# Patient Record
Sex: Male | Born: 1985 | Hispanic: Yes | Marital: Married | Smoking: Never smoker
Health system: Southern US, Community
[De-identification: ages and names within clinical notes are randomized; demographics above are authoritative.]

## PROBLEM LIST (undated history)

## (undated) DIAGNOSIS — E1142 Type 2 diabetes mellitus with diabetic polyneuropathy: Secondary | ICD-10-CM

## (undated) DIAGNOSIS — E785 Hyperlipidemia, unspecified: Secondary | ICD-10-CM

## (undated) DIAGNOSIS — E119 Type 2 diabetes mellitus without complications: Secondary | ICD-10-CM

## (undated) HISTORY — DX: Type 2 diabetes mellitus with diabetic polyneuropathy: E11.42

## (undated) HISTORY — DX: Hyperlipidemia, unspecified: E78.5

---

## 2017-12-06 ENCOUNTER — Ambulatory Visit: Payer: Self-pay | Admitting: Internal Medicine

## 2017-12-06 ENCOUNTER — Encounter: Payer: Self-pay | Admitting: Internal Medicine

## 2017-12-06 VITALS — BP 130/88 | HR 84 | Resp 12 | Ht 65.0 in | Wt 152.0 lb

## 2017-12-06 DIAGNOSIS — E119 Type 2 diabetes mellitus without complications: Secondary | ICD-10-CM

## 2017-12-06 DIAGNOSIS — E1149 Type 2 diabetes mellitus with other diabetic neurological complication: Secondary | ICD-10-CM | POA: Insufficient documentation

## 2017-12-06 DIAGNOSIS — N5082 Scrotal pain: Secondary | ICD-10-CM

## 2017-12-06 DIAGNOSIS — I10 Essential (primary) hypertension: Secondary | ICD-10-CM | POA: Insufficient documentation

## 2017-12-06 DIAGNOSIS — Z23 Encounter for immunization: Secondary | ICD-10-CM

## 2017-12-06 LAB — POCT URINALYSIS DIPSTICK
Bilirubin, UA: NEGATIVE
Ketones, UA: NEGATIVE
LEUKOCYTES UA: NEGATIVE
NITRITE UA: NEGATIVE
PH UA: 5 (ref 5.0–8.0)
RBC UA: NEGATIVE
SPEC GRAV UA: 1.02 (ref 1.010–1.025)
Urobilinogen, UA: 0.2 E.U./dL

## 2017-12-06 LAB — GLUCOSE, POCT (MANUAL RESULT ENTRY): POC GLUCOSE: 301 mg/dL — AB (ref 70–99)

## 2017-12-06 MED ORDER — AGAMATRIX ULTRA-THIN LANCETS MISC: 11 refills | Status: DC

## 2017-12-06 MED ORDER — LISINOPRIL 10 MG PO TABS: 10.0000 mg | ORAL_TABLET | Freq: Every day | ORAL | 11 refills | Status: DC

## 2017-12-06 MED ORDER — GLIPIZIDE 5 MG PO TABS: ORAL_TABLET | ORAL | 11 refills | Status: DC

## 2017-12-06 MED ORDER — AGAMATRIX PRESTO W/DEVICE KIT: PACK | 0 refills | Status: DC

## 2017-12-06 MED ORDER — GLUCOSE BLOOD VI STRP: ORAL_STRIP | 12 refills | Status: DC

## 2017-12-06 MED ORDER — METFORMIN HCL 500 MG PO TABS: ORAL_TABLET | ORAL | 11 refills | Status: DC

## 2017-12-06 NOTE — Progress Notes (Signed)
Social TEFL teacherworker intern performed a new patient's screening to assess for depression symptoms and SDOH issues. Pt. Reported that due to physical issues he has been feeling little interest doing things. Pt reported feeling without energy due to lack of sleep because of pain in his lower extremities. Additionally, pt reported feeling stress and moving slowly due to pain.

## 2017-12-06 NOTE — Progress Notes (Signed)
"    Patient ID: Keith Barnes, male    DOB: [birth year], 32 y.o.   MRN: [id number]  HPI   Here to establish  Went to the [facility] about 25 +days ago as he had pain in his right scrotum and ultimately diagnosed with DM, Essential Hypertension.  He was told the pain in his right groin was a "cyst".  1.  DM:  As above.  States his sugar was 350 in the other office.  Was started on Metformin 500 mg once daily.  He continues with the Metformin.  He also started on a natural medication his visiting friend recommended.  He takes this once daily as well.  States he does not have polydipsia or polyuria.  He states prior to getting started on medications, he did have these symptoms, for about 2 months. His mother has DM.  Diagnosed at age 54. His toes have been numb and itchy. No problem with skin rash there.  2.  Essential Hypertension:  On Lisinopril 10 mg daily for about 25 days as well. He has not had any bloodwork done since starting.  3.  Right groin pain for 15 days ago:  Took Amoxicillin 500 mg twice daily for 5 days.  Did not note any change in the pain. States the pain is not in his testicle.   He does not see any abnormality or feel any lesion. The pain is in the groin and possibly involving the posterior right scrotum or perineum--not clear which. No fevers.  No penile discharge.   Married and sexually active with only his wife. 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       Current Meds  Medication Sig  . lisinopril (PRINIVIL,ZESTRIL) 10 MG tablet Take 1 tablet (10 mg total) by mouth daily.  . metFORMIN (GLUCOPHAGE) 500 MG tablet 1 tab by mouth twice daily with meals  . [DISCONTINUED] lisinopril (PRINIVIL,ZESTRIL) 10 MG tablet Take 10 mg by mouth daily.  . [DISCONTINUED] metFORMIN (GLUCOPHAGE) 500 MG tablet Take by mouth daily with breakfast.    No Known  Allergies   History reviewed. No pertinent past medical history.   History reviewed. No pertinent surgical history.   History reviewed. No pertinent family history.   Social History   Socioeconomic History  . Marital status: Single    Spouse name: Not on file  . Number of children: 1  . Years of education: Not on file  . Highest education level: Not on file  Social Needs  . Financial resource strain: Not on file  . Food insecurity - worry: Not on file  . Food insecurity - inability: Not on file  . Transportation needs - medical: No  . Transportation needs - non-medical: No  Occupational History  . Occupation: cook    Comment: [employer]  Tobacco Use  . Smoking status: Never Smoker  . Smokeless tobacco: Never Used  Substance and Sexual Activity  . Alcohol use: No    Frequency: Never    Comment: Did drink a lot of beer before diagnosis of DM, but none now.  . Drug use: No  . Sexual activity: Yes  Other Topics Concern  . Not on file  Social History Narrative   Lives at home with wife and 2 yo son.      Review of Systems     Objective:  Physical Exam  NAD HEENT:  PERRL, EOMI, discs sharp.  Mild scleral icterus?  Mild periorbital fat vs edema.  TMs pearly gray. Gums with severe recession and many loose and crooked, decayed teeth.  Throat without injection.  MMM Neck: Supple, No adenopathy, no thyromegaly Chest:  CTA CV:  RRR with normal S1 and S2, no S3, S4, or murmur.  Radial and DP pulses normal and equal Abd:  S, NT, No HSM or mass, + BS.  No prominence of abdominal wall veins.  No fluid wave. LE:  No edema.   UE:  No palmar erythema. GU:  Normal uncircumcised male, lack of foreskin/glans hygiene, but no penile discharge or genital lesion.  No testicular mass or tenderness.  Mildly tender at ventral base really of penile shaft with palpation through posterior scrotum midline.  Not clear exactly what is tender.  No erythema.  NT on perineum.       Assessment  & Plan:  1.  DM:  Start Glipizide 5 mg and increase Metformin to 500 mg both twice daily with meals. Once he gets and orange card, to obtain glucometer and testing supplies and keep track of sugars. Check renal function with CMP after start of Lisinopril and Metformin as well.  2.  Essential Hypertension:  Continue Lisinopril as above.  3.  ?Hyperbilirubinemia:  CMP for Tbil.  History of heavy alcohol use until recent diagnosis of DM.  4.  Posterior scrotal area tenderness vs. Penile shaft tenderness.  This does not seem to be even moderately painful.  He does state it is much better.  Follow for now.  [facility]  5.  Alcoholism:  Would like for him to recontact N. Aviva SignsWasserberg for counseling to maintain sobriety.  Follow up in 4 weeks.

## 2017-12-07 LAB — COMPREHENSIVE METABOLIC PANEL
A/G RATIO: 1.4 (ref 1.2–2.2)
ALBUMIN: 4.5 g/dL (ref 3.5–5.5)
ALT: 20 IU/L (ref 0–44)
AST: 12 IU/L (ref 0–40)
Alkaline Phosphatase: 79 IU/L (ref 39–117)
BUN / CREAT RATIO: 19 (ref 9–20)
BUN: 12 mg/dL (ref 6–20)
Bilirubin Total: 1.4 mg/dL — ABNORMAL HIGH (ref 0.0–1.2)
CALCIUM: 9.7 mg/dL (ref 8.7–10.2)
CO2: 21 mmol/L (ref 20–29)
CREATININE: 0.62 mg/dL — AB (ref 0.76–1.27)
Chloride: 99 mmol/L (ref 96–106)
GFR calc Af Amer: 153 mL/min/{1.73_m2} (ref >59)
GFR, EST NON AFRICAN AMERICAN: 132 mL/min/{1.73_m2} (ref >59)
GLOBULIN, TOTAL: 3.3 g/dL (ref 1.5–4.5)
Glucose: 268 mg/dL — ABNORMAL HIGH (ref 65–99)
POTASSIUM: 4.2 mmol/L (ref 3.5–5.2)
SODIUM: 137 mmol/L (ref 134–144)
Total Protein: 7.8 g/dL (ref 6.0–8.5)

## 2018-01-01 LAB — SPECIMEN STATUS REPORT

## 2018-01-01 LAB — BILIRUBIN, DIRECT: Bilirubin, Direct: 0.1 mg/dL (ref 0.00–0.40)

## 2018-01-10 ENCOUNTER — Other Ambulatory Visit: Payer: Self-pay

## 2018-01-11 LAB — HEPATIC FUNCTION PANEL
ALT: 19 IU/L (ref 0–44)
AST: 16 IU/L (ref 0–40)
Albumin: 4.7 g/dL (ref 3.5–5.5)
Alkaline Phosphatase: 54 IU/L (ref 39–117)
Bilirubin Total: 1.6 mg/dL — ABNORMAL HIGH (ref 0.0–1.2)
Bilirubin, Direct: 0.34 mg/dL (ref 0.00–0.40)
Total Protein: 7.9 g/dL (ref 6.0–8.5)

## 2018-01-11 LAB — DIRECT ANTIGLOBULIN TEST (NOT AT ARMC): COOMBS', DIRECT: NEGATIVE

## 2018-01-11 LAB — LACTATE DEHYDROGENASE: LDH: 149 IU/L (ref 121–224)

## 2018-01-17 ENCOUNTER — Ambulatory Visit: Payer: Self-pay | Admitting: Internal Medicine

## 2018-01-17 ENCOUNTER — Encounter: Payer: Self-pay | Admitting: Internal Medicine

## 2018-01-17 VITALS — BP 126/82 | HR 92 | Resp 12 | Ht 65.0 in | Wt 149.0 lb

## 2018-01-17 DIAGNOSIS — G6289 Other specified polyneuropathies: Secondary | ICD-10-CM

## 2018-01-17 DIAGNOSIS — I10 Essential (primary) hypertension: Secondary | ICD-10-CM

## 2018-01-17 DIAGNOSIS — E119 Type 2 diabetes mellitus without complications: Secondary | ICD-10-CM

## 2018-01-17 LAB — GLUCOSE, POCT (MANUAL RESULT ENTRY): POC Glucose: 211 mg/dl — AB (ref 70–99)

## 2018-01-17 MED ORDER — GABAPENTIN 100 MG PO CAPS: ORAL_CAPSULE | ORAL | 3 refills | Status: DC

## 2018-01-17 NOTE — Progress Notes (Signed)
   Subjective:    Patient ID: Keith Barnes, male    DOB: 1986/08/06, 32 y.o.   MRN: [id number]  HPI   Records from [facility] are difficult to read, but appears he was treated for DM, hypertension and orchitis with previously mentioned medications (last OV) on 10/30/17. Not clear why Amoxicillin utilized.  1.  DM:  Did not get diabetic supplies yet, so not checking.  He denies polydipsia or polyuria.  Feels much better. Not clear still however, what his sugars are running. He is eating better, but describes eating 6 servings of fruit and 2 of vegetables.   2.  Alcohol abuse:  No alcohol for 3 months.  His total bilirubin was mildly elevated, appears to be indirect etiology, but does not appear to be a problem with hemolysis as LDH and direct coombs normal.  3.  Pain in feet  Current Meds  Medication Sig  . AGAMATRIX ULTRA-THIN LANCETS MISC Check sugars twice daily before meals  . Blood Glucose Monitoring Suppl (AGAMATRIX PRESTO) w/Device KIT Check sugars twice daily before meals  . glipiZIDE (GLUCOTROL) 5 MG tablet 1 tab by mouth twice daily with meals  . glucose blood (AGAMATRIX PRESTO TEST) test strip Check sugars twice daily before meals  . lisinopril (PRINIVIL,ZESTRIL) 10 MG tablet Take 1 tablet (10 mg total) by mouth daily.  . metFORMIN (GLUCOPHAGE) 500 MG tablet 1 tab by mouth twice daily with meals   No Known Allergies  Review of Systems     Objective:   Physical Exam   NAD HEENT:  Scleral icterus appears resolved.  Throat without injection Neck: Supple, No adenopathy Chest:  CTA CV:  RRR without murmur or rub. Radial pulses normal and equal Abd:  S, NT, No HSM  Diabetic Foot Exam - Simple   Simple Foot Form Visual Inspection No deformities, no ulcerations, no other skin breakdown bilaterally:  Yes Sensation Testing See comments:  Yes Pulse Check Posterior Tibialis and Dorsalis pulse intact bilaterally:  Yes Comments Decreased sensation to  monofilament most of left plantar foot.  Right missing at MTP plantar area of 1st ray.  Dorsal feet with sensation.         Assessment & Plan:  1. DM:  Likely improved, but needs to get his diabetic supplies.  Discussed how to document sugars in journal.  To bring in in 1 month for evaluation.  With me in 3 months.  2.  Peripheral neuropathy:  Start bedtime Gabapentin and titrate to 300 mg at bedtime only for now.  3.  Mild indirect hyperbilirubinemia:  No hemolysis noted.  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  No alcohol  4.  Alcohol Abuse:  Seems to be able to stay away from this now.

## 2018-01-23 ENCOUNTER — Other Ambulatory Visit: Payer: Self-pay

## 2018-01-23 MED ORDER — GABAPENTIN 100 MG PO CAPS: ORAL_CAPSULE | ORAL | 1 refills | Status: DC

## 2018-03-16 DIAGNOSIS — N433 Hydrocele, unspecified: Secondary | ICD-10-CM | POA: Insufficient documentation

## 2018-03-16 DIAGNOSIS — N503 Cyst of epididymis: Secondary | ICD-10-CM

## 2018-03-16 HISTORY — DX: Hydrocele, unspecified: N43.3

## 2018-03-16 HISTORY — DX: Cyst of epididymis: N50.3

## 2018-04-04 ENCOUNTER — Emergency Department (HOSPITAL_COMMUNITY): Payer: Self-pay

## 2018-04-04 ENCOUNTER — Encounter (HOSPITAL_COMMUNITY): Payer: Self-pay | Admitting: Emergency Medicine

## 2018-04-04 ENCOUNTER — Other Ambulatory Visit: Payer: Self-pay

## 2018-04-04 ENCOUNTER — Emergency Department (HOSPITAL_COMMUNITY)
Admission: EM | Admit: 2018-04-04 | Discharge: 2018-04-04 | Disposition: A | Discharge status: Home / Self Care | Payer: Self-pay | Attending: Emergency Medicine | Admitting: Emergency Medicine

## 2018-04-04 DIAGNOSIS — Z7984 Long term (current) use of oral hypoglycemic drugs: Secondary | ICD-10-CM | POA: Insufficient documentation

## 2018-04-04 DIAGNOSIS — I10 Essential (primary) hypertension: Secondary | ICD-10-CM | POA: Insufficient documentation

## 2018-04-04 DIAGNOSIS — E119 Type 2 diabetes mellitus without complications: Secondary | ICD-10-CM | POA: Insufficient documentation

## 2018-04-04 DIAGNOSIS — R109 Unspecified abdominal pain: Secondary | ICD-10-CM | POA: Insufficient documentation

## 2018-04-04 DIAGNOSIS — N50819 Testicular pain, unspecified: Secondary | ICD-10-CM | POA: Insufficient documentation

## 2018-04-04 HISTORY — DX: Type 2 diabetes mellitus without complications: E11.9

## 2018-04-04 LAB — COMPREHENSIVE METABOLIC PANEL
ALT: 15 U/L — ABNORMAL LOW (ref 17–63)
AST: 20 U/L (ref 15–41)
Albumin: 4.3 g/dL (ref 3.5–5.0)
Alkaline Phosphatase: 45 U/L (ref 38–126)
Anion gap: 8 (ref 5–15)
BUN: 9 mg/dL (ref 6–20)
CHLORIDE: 105 mmol/L (ref 101–111)
CO2: 26 mmol/L (ref 22–32)
Calcium: 9.7 mg/dL (ref 8.9–10.3)
Creatinine, Ser: 0.6 mg/dL — ABNORMAL LOW (ref 0.61–1.24)
GFR calc Af Amer: >60 mL/min (ref >60)
GLUCOSE: 139 mg/dL — AB (ref 65–99)
POTASSIUM: 3.8 mmol/L (ref 3.5–5.1)
Sodium: 139 mmol/L (ref 135–145)
Total Bilirubin: 2 mg/dL — ABNORMAL HIGH (ref 0.3–1.2)
Total Protein: 7.6 g/dL (ref 6.5–8.1)

## 2018-04-04 LAB — CBC
HEMATOCRIT: 36 % — AB (ref 39.0–52.0)
Hemoglobin: 12.5 g/dL — ABNORMAL LOW (ref 13.0–17.0)
MCH: 31.2 pg (ref 26.0–34.0)
MCHC: 34.7 g/dL (ref 30.0–36.0)
MCV: 89.8 fL (ref 78.0–100.0)
PLATELETS: 243 10*3/uL (ref 150–400)
RBC: 4.01 MIL/uL — ABNORMAL LOW (ref 4.22–5.81)
RDW: 11.9 % (ref 11.5–15.5)
WBC: 6.6 10*3/uL (ref 4.0–10.5)

## 2018-04-04 LAB — URINALYSIS, ROUTINE W REFLEX MICROSCOPIC
BILIRUBIN URINE: NEGATIVE
GLUCOSE, UA: NEGATIVE mg/dL
HGB URINE DIPSTICK: NEGATIVE
KETONES UR: NEGATIVE mg/dL
Leukocytes, UA: NEGATIVE
Nitrite: NEGATIVE
PH: 6 (ref 5.0–8.0)
PROTEIN: NEGATIVE mg/dL
Specific Gravity, Urine: 1.014 (ref 1.005–1.030)

## 2018-04-04 LAB — CBG MONITORING, ED: Glucose-Capillary: 129 mg/dL — ABNORMAL HIGH (ref 65–99)

## 2018-04-04 LAB — LIPASE, BLOOD: LIPASE: 35 U/L (ref 11–51)

## 2018-04-04 MED ORDER — ONDANSETRON 4 MG PO TBDP
4.0000 mg | ORAL_TABLET | Freq: Once | ORAL | Status: AC
  Administered 2018-04-04: 4 mg via ORAL
  Filled 2018-04-04: qty 1

## 2018-04-04 MED ORDER — METFORMIN HCL 500 MG PO TABS: ORAL_TABLET | ORAL | 11 refills | Status: DC

## 2018-04-04 MED ORDER — TRAMADOL HCL 50 MG PO TABS: 50.0000 mg | ORAL_TABLET | Freq: Four times a day (QID) | ORAL | 0 refills | Status: DC | PRN

## 2018-04-04 MED ORDER — IOHEXOL 300 MG/ML  SOLN
100.0000 mL | Freq: Once | INTRAMUSCULAR | Status: AC | PRN
  Administered 2018-04-04: 100 mL via INTRAVENOUS

## 2018-04-04 MED ORDER — LISINOPRIL 10 MG PO TABS: 10.0000 mg | ORAL_TABLET | Freq: Every day | ORAL | 11 refills | Status: DC

## 2018-04-04 MED ORDER — HYDROCODONE-ACETAMINOPHEN 5-325 MG PO TABS
1.0000 | ORAL_TABLET | Freq: Once | ORAL | Status: AC
  Administered 2018-04-04: 1 via ORAL
  Filled 2018-04-04: qty 1

## 2018-04-04 NOTE — ED Notes (Signed)
Pt informed he needs to provide UA when possible. Pt given urinal / verbalized understanding.   

## 2018-04-04 NOTE — ED Triage Notes (Signed)
Spanish translator used:  Pt c/o nausea/vomiting/constant lower abdominal pain x 1 week. Hx diabetes, has not been checking blood sugars at home. Also c/o bilateral foot pain x 1 month. Denies chest pain/shortness of breath.

## 2018-04-04 NOTE — ED Provider Notes (Signed)
[facility] EMERGENCY DEPARTMENT Provider Note   CSN: [id number] Arrival date & time: 04/04/18  1603     History   Chief Complaint Chief Complaint  Patient presents with  . Abdominal Pain    HPI Keith Barnes is a 32 y.o. male.  HPI   32 year old male presents today with numerous complaints.  Patient notes several month history of diffuse abdominal pain, he notes this is worse after eating.  Patient notes intermittent diarrhea, intermittent nausea and vomiting.  Patient also reports pain in his testicles bilateral, he notes he is been seen as an outpatient for this with no significant findings.  Patient denies any penile discharge, dysuria swelling or edema; no trauma to the testicles.  Patient reports bilateral leg pain with intermittent numbness and tingling.  Patient notes very minimal lower lumbar pain.  He denies any distal extremity weakness.  Denies any fever or chills.  Past Medical History:  Diagnosis Date  . Diabetes mellitus without complication [facility])     Patient Active Problem List   Diagnosis Date Noted  . Type 2 diabetes mellitus without complication, without long-term current use of insulin ([place]) 12/06/2017  . Essential hypertension 12/06/2017    History reviewed. No pertinent surgical history.      Home Medications    Prior to Admission medications   Medication Sig Start Date End Date Taking? Authorizing Provider  Truddie Crumble ULTRA-THIN LANCETS MISC Check sugars twice daily before meals 12/06/17   Mack Hook, MD  Blood Glucose Monitoring Suppl (AGAMATRIX PRESTO) w/Device KIT Check sugars twice daily before meals 12/06/17   Mack Hook, MD  gabapentin (NEURONTIN) 100 MG capsule 1 cap by mouth at bedtime for 3 nights then increase to 2 caps for 3 days then increase to 3 caps and stay on that dose 01/23/18   Mack Hook, MD  glipiZIDE (GLUCOTROL) 5 MG tablet 1 tab by mouth twice daily with meals 12/06/17   Mack Hook, MD  glucose blood (AGAMATRIX PRESTO TEST) test strip Check sugars twice daily before meals 12/06/17   Mack Hook, MD  lisinopril (PRINIVIL,ZESTRIL) 10 MG tablet Take 1 tablet (10 mg total) by mouth daily. 04/04/18   Christan Defranco, Dellis Filbert, PA-C  metFORMIN (GLUCOPHAGE) 500 MG tablet 1 tab by mouth twice daily with meals 04/04/18   Evaline Waltman, Dellis Filbert, PA-C  traMADol (ULTRAM) 50 MG tablet Take 1 tablet (50 mg total) by mouth every 6 (six) hours as needed. 04/04/18   Okey Regal, PA-C    Family History No family history on file.  Social History Social History   Tobacco Use  . Smoking status: Never Smoker  . Smokeless tobacco: Never Used  Substance Use Topics  . Alcohol use: No    Frequency: Never    Comment: Did drink a lot of beer before diagnosis of DM, but none now.  . Drug use: No     Allergies   Patient has no known allergies.   Review of Systems Review of Systems  All other systems reviewed and are negative.    Physical Exam Updated Vital Signs BP 122/84 (BP Location: Right Arm)   Pulse (!) 55   Resp 14   SpO2 100%   Physical Exam  Constitutional: He is oriented to person, place, and time. He appears well-developed and well-nourished.  HENT:  Head: Normocephalic and atraumatic.  Eyes: Pupils are equal, round, and reactive to light. Conjunctivae are normal. Right eye exhibits no discharge. Left eye exhibits no discharge. No scleral icterus.  Neck: Normal range of motion. No JVD present. No tracheal deviation present.  Pulmonary/Chest: Effort normal. No stridor.  Abdominal:  Diffuse abdominal tenderness, nonfocal, no rebound or guarding, no masses  Genitourinary:  Genitourinary Comments: Uncircumcised penis, no penile discharge, bilateral descended testicles with diffuse tenderness, no swelling or edema, no hernias noted  Musculoskeletal:  Distal sensation strength and motor function intact-no significant tenderness to the CT or L-spine or surrounding  soft tissues  Neurological: He is alert and oriented to person, place, and time. Coordination normal.  Skin: Skin is warm.  Psychiatric: He has a normal mood and affect. His behavior is normal. Judgment and thought content normal.  Nursing note and vitals reviewed.   ED Treatments / Results  Labs (all labs ordered are listed, but only abnormal results are displayed) Labs Reviewed  COMPREHENSIVE METABOLIC PANEL - Abnormal; Notable for the following components:      Result Value   Glucose, Bld 139 (*)    Creatinine, Ser 0.60 (*)    ALT 15 (*)    Total Bilirubin 2.0 (*)    All other components within normal limits  CBC - Abnormal; Notable for the following components:   RBC 4.01 (*)    Hemoglobin 12.5 (*)    HCT 36.0 (*)    All other components within normal limits  URINALYSIS, ROUTINE W REFLEX MICROSCOPIC - Abnormal; Notable for the following components:   APPearance HAZY (*)    All other components within normal limits  CBG MONITORING, ED - Abnormal; Notable for the following components:   Glucose-Capillary 129 (*)    All other components within normal limits  LIPASE, BLOOD    EKG None  Radiology Ct Abdomen Pelvis W Contrast  Result Date: 04/04/2018 CLINICAL DATA:  Nausea, vomiting and lower abdominal pain for 1 week. Foot pain for months. History of diabetes. EXAM: CT ABDOMEN AND PELVIS WITH CONTRAST TECHNIQUE: Multidetector CT imaging of the abdomen and pelvis was performed using the standard protocol following bolus administration of intravenous contrast. CONTRAST:  138m OMNIPAQUE IOHEXOL 300 MG/ML  SOLN COMPARISON:  None. FINDINGS: LOWER CHEST: Lung bases are clear. Included heart size is normal. No pericardial effusion. HEPATOBILIARY: Liver and gallbladder are normal. PANCREAS: Normal. SPLEEN: Normal. ADRENALS/URINARY TRACT: Kidneys are orthotopic, demonstrating symmetric enhancement. No nephrolithiasis, hydronephrosis or solid renal masses. The unopacified ureters are  normal in course and caliber. Urinary bladder is partially distended and unremarkable. Normal adrenal glands. STOMACH/BOWEL: The stomach, small and large bowel are normal in course and caliber without inflammatory changes. Normal appendix. VASCULAR/LYMPHATIC: Aortoiliac vessels are normal in course and caliber. No lymphadenopathy by CT size criteria. REPRODUCTIVE: Normal. OTHER: No intraperitoneal free fluid or free air. MUSCULOSKELETAL: Nonacute.  Mild L1-2 ventral endplate spurring. IMPRESSION: Negative contrast enhanced CT abdomen and pelvis. Electronically Signed   By: CElon AlasM.D.   On: 04/04/2018 19:46   UKoreaScrotum W/doppler  Result Date: 04/04/2018 CLINICAL DATA:  Testicular pain for 1 week EXAM: SCROTAL ULTRASOUND DOPPLER ULTRASOUND OF THE TESTICLES TECHNIQUE: Complete ultrasound examination of the testicles, epididymis, and other scrotal structures was performed. Color and spectral Doppler ultrasound were also utilized to evaluate blood flow to the testicles. COMPARISON:  None. FINDINGS: Right testicle Measurements: 4.6 x 2.3 x 2.9 cm. No mass or microlithiasis visualized. Slightly heterogeneous echotexture. Left testicle Measurements: 5 x 2.2 x 3.3 cm. No mass or microlithiasis visualized. Right epididymis:  Small cysts measuring 0.5 and 0.6 cm. Left epididymis:  Normal in size and appearance. Hydrocele:  Tiny right hydrocele. Varicocele:  None visualized. Pulsed Doppler interrogation of both testes demonstrates normal low resistance arterial and venous waveforms bilaterally. IMPRESSION: 1. Negative for testicular torsion or testicular mass. 2. Tiny right hydrocele.  Small right epididymal cysts Electronically Signed   By: Donavan Foil M.D.   On: 04/04/2018 18:46    Procedures Procedures (including critical care time)  Medications Ordered in ED Medications  HYDROcodone-acetaminophen (NORCO/VICODIN) 5-325 MG per tablet 1 tablet (1 tablet Oral Given 04/04/18 1620)  ondansetron  (ZOFRAN-ODT) disintegrating tablet 4 mg (4 mg Oral Given 04/04/18 1620)  iohexol (OMNIPAQUE) 300 MG/ML solution 100 mL (100 mLs Intravenous Contrast Given 04/04/18 1917)     Initial Impression / Assessment and Plan / ED Course  I have reviewed the triage vital signs and the nursing notes.  Pertinent labs & imaging results that were available during my care of the patient were reviewed by me and considered in my medical decision making (see chart for details).     Labs: Urinalysis, lipase, CMP, CBC, point-of-care CBG  Imaging: Ultrasound scrotum with Doppler, CT abdomen pelvis with contrast  Consults:  Therapeutics:  Discharge Meds:   Assessment/Plan: 32 year old male presents today with numerous complaints.  Patient with abdominal pain testicular pain and radiculopathy in his lower extremities.  Patient has very reassuring work-up with no signs of infectious etiology, he has reassuring CT reassuring ultrasound no signs of torsion, significant intra-abdominal pathology.  I question radiculopathy given patient's complaints of minor low back pain with radiation to his lower extremities.  He was previously prescribed gabapentin for this.  Patient given short course of Ultram, encouraged to follow-up as an outpatient with primary care, given strict return precautions.  He verbalized understanding and agreement to today's plan.  Both in-house and video Spanish interpreters were used for today's visit.   Final Clinical Impressions(s) / ED Diagnoses   Final diagnoses:  Testicular pain  Abdominal pain, unspecified abdominal location    ED Discharge Orders        Ordered    traMADol (ULTRAM) 50 MG tablet  Every 6 hours PRN     04/04/18 2045    lisinopril (PRINIVIL,ZESTRIL) 10 MG tablet  Daily     04/04/18 2046    metFORMIN (GLUCOPHAGE) 500 MG tablet     04/04/18 2046       Okey Regal, PA-C 04/05/18 1250    Fredia Sorrow, MD 04/05/18 1422

## 2018-04-04 NOTE — ED Provider Notes (Signed)
MSE was initiated and I personally evaluated the patient and placed orders (if any) at  4:14 PM on April 04, 2018.  The patient appears stable so that the remainder of the MSE may be completed by another provider.    Patient placed in Quick Look pathway, seen and evaluated   Chief Complaint: leg pain and abd pain  HPI:   Hx of DM, 1 month of BL leg pain and numbness, + lower abd pain, N/v. Taking a "sugar pill and a heart pill" pain /n/v for 1 week  ROS: abd pain/ n/v (one)  Physical Exam:   Gen: No distress  Neuro: Awake and Alert  Skin: Warm    Focused Exam: Normal distal pulses, TTP periumbilical   Initiation of care has begun. The patient has been counseled on the process, plan, and necessity for staying for the completion/evaluation, and the remainder of the medical screening examination    Arthor CaptainHarris, Berenise Hunton, Cordelia Poche-C 04/04/18 1614    Linwood DibblesKnapp, Jon, MD 04/07/18 2023

## 2018-04-04 NOTE — ED Notes (Signed)
Pt verbalized understanding discharge instructions and denies any further needs or questions at this time. VS stable, ambulatory and steady gait.   See EDP assessment / note.   Translator used

## 2018-04-04 NOTE — Discharge Instructions (Addendum)
Please read attached information. If you experience any new or worsening signs or symptoms please return to the emergency room for evaluation. Please follow-up with your primary care provider or specialist as discussed. Please use medication prescribed only as directed and discontinue taking if you have any concerning signs or symptoms.   °

## 2018-04-04 NOTE — ED Notes (Signed)
Pt to ultrasound

## 2018-04-17 ENCOUNTER — Other Ambulatory Visit: Payer: Self-pay

## 2018-04-17 DIAGNOSIS — Z13220 Encounter for screening for lipoid disorders: Secondary | ICD-10-CM

## 2018-04-17 DIAGNOSIS — E119 Type 2 diabetes mellitus without complications: Secondary | ICD-10-CM

## 2018-04-18 LAB — LIPID PANEL W/O CHOL/HDL RATIO
Cholesterol, Total: 119 mg/dL (ref 100–199)
HDL: 51 mg/dL (ref >39)
LDL CALC: 52 mg/dL (ref 0–99)
Triglycerides: 81 mg/dL (ref 0–149)
VLDL CHOLESTEROL CAL: 16 mg/dL (ref 5–40)

## 2018-04-18 LAB — HGB A1C W/O EAG: HEMOGLOBIN A1C: 6.1 % — AB (ref 4.8–5.6)

## 2018-04-22 ENCOUNTER — Other Ambulatory Visit: Payer: Self-pay

## 2018-04-25 ENCOUNTER — Ambulatory Visit: Payer: Self-pay | Admitting: Internal Medicine

## 2018-04-25 ENCOUNTER — Encounter: Payer: Self-pay | Admitting: Internal Medicine

## 2018-04-25 VITALS — BP 110/70 | HR 88 | Resp 12 | Ht 65.0 in | Wt 150.0 lb

## 2018-04-25 DIAGNOSIS — B353 Tinea pedis: Secondary | ICD-10-CM

## 2018-04-25 DIAGNOSIS — E119 Type 2 diabetes mellitus without complications: Secondary | ICD-10-CM

## 2018-04-25 DIAGNOSIS — I10 Essential (primary) hypertension: Secondary | ICD-10-CM

## 2018-04-25 LAB — GLUCOSE, POCT (MANUAL RESULT ENTRY): POC GLUCOSE: 150 mg/dL — AB (ref 70–99)

## 2018-04-25 MED ORDER — GLUCOSE BLOOD VI STRP: ORAL_STRIP | 12 refills | Status: DC

## 2018-04-25 MED ORDER — AGAMATRIX ULTRA-THIN LANCETS MISC: 11 refills | Status: DC

## 2018-04-25 MED ORDER — TERBINAFINE HCL 1 % EX CREA: TOPICAL_CREAM | CUTANEOUS | 0 refills | Status: DC

## 2018-04-25 MED ORDER — AGAMATRIX PRESTO W/DEVICE KIT: PACK | 0 refills | Status: DC

## 2018-04-25 MED ORDER — GABAPENTIN 100 MG PO CAPS: ORAL_CAPSULE | ORAL | 11 refills | Status: DC

## 2018-04-25 MED ORDER — TEA TREE 100 % EX OIL: TOPICAL_OIL | CUTANEOUS | Status: DC

## 2018-04-25 NOTE — Patient Instructions (Signed)
Gabapentin 100 mg capsula:  Manana, anade 1 capsula en la manana y sigue 3 capsulas en la noche En 3 dias, sube a 2 capsulas en la manana y 3 capsulas en la noche En 3 dias, sube a 3 capsulas en la manana y 3 capsulas en la noche En 3 dias sigue 3 capsulas en la manana y la noche y sigue 1 capsula medio dia En 3 dias, sube 2 capsulas el medio dia y sigue 3 capsulas en la manana y en la noche En 3 dias sube 3 capsulas el medio dia, en la manana y en la nocheGabapentin 100 mg capsula: 1 capsula en la noche.  En 3 dias, sube a 2 capsulas en la noche, en 3 mas dias, sube a 3 capsulas en la noche. En 3 dias, anade 1 capsula en la manana y sigue 3 capsulas en la noche En 3 dias, sube a 2 capsulas en la manana y 3 capsulas en la noche En 3 dias, sube a 3 capsulas en la manana y 3 capsulas en la noche En 3 dias sigue 3 capsulas en la manana y la noche y sigue 1 capsula medio dia En 3 dias, sube 2 capsulas el medio dia y sigue 3 capsulas en la manana y en la noche En 3 dias sube 3 capsulas el medio dia, en la manana y en la noche

## 2018-04-25 NOTE — Progress Notes (Signed)
Subjective:    Patient ID: Keith Barnes, male    DOB: 10/23/1985, 32 y.o.   MRN: [id number]  HPI   1.  Recent ED visit for multiple complaints:  States has abdominal pain which radiated down into legs and went straight to ED without calling clinic.  Discussed very important to call here first and see if we can work him in.  CT scan of abdomen and pelvis, labs and physical exam were all normal  2.  DM:  [facility]  Just obtained orange card last week.   Recent A1C quite good at 6.1% on 04/17/2018.   States he notes he has felt much better since back on medication.  3.  Diabetic peripheral neuropathy improved with numbness only in right great toe.  The pain in his feet is only at night.  On his feet all day at work.    Taking gabapentin 300 mg only at bedtime. He feels this has helped. Discussed would not prescribe Tramadol for his discomfort at this time.  4.  Essential Hypertension:  Nicely controlled on Lisinopril  5.  Cholesterol at goal without medication last week.   Lipid Panel     Component Value Date/Time   CHOL 119 04/17/2018 1014   TRIG 81 04/17/2018 1014   HDL 51 04/17/2018 1014   LDLCALC 52 04/17/2018 1014   6.  Foot scaling:  States has had for some time.  Started as what he describes and itchy blood blisters of plantar feet and then became scaly.    Current Meds  Medication Sig  . gabapentin (NEURONTIN) 100 MG capsule Titrate to 3 caps by mouth 3 times daily in the morning, midday, and before bedtime  . glipiZIDE (GLUCOTROL) 5 MG tablet 1 tab by mouth twice daily with meals  . lisinopril (PRINIVIL,ZESTRIL) 10 MG tablet Take 1 tablet (10 mg total) by mouth daily.  . metFORMIN (GLUCOPHAGE) 500 MG tablet 1 tab by mouth twice daily with meals  . [DISCONTINUED] gabapentin (NEURONTIN) 100 MG capsule 1 cap by mouth at bedtime for 3 nights then increase to 2 caps for 3 days then increase to 3 caps and stay on that dose    No Known  Allergies   Review of Systems     Objective:   Physical Exam  NAD HEENT;  PERRL, EOMI Neck:  Supple, No adenopathy Chest:  CTA CV: RRR with normal S1 and S2, No S3, S4 or murmur.  Radial and DP pulses normal and equal. Abd:  S, mild bilateral lower quadrant tenderness.  No HSM or mass, + BS LE:  No edema. Diabetic Foot Exam - Simple   Simple Foot Form Diabetic Foot exam was performed with the following findings:  Yes 04/25/2018  3:16 PM  Visual Inspection No deformities, no ulcerations, no other skin breakdown bilaterally:  Yes See comments:  Yes Sensation Testing Intact to touch and monofilament testing bilaterally:  Yes Pulse Check Posterior Tibialis and Dorsalis pulse intact bilaterally:  Yes Comments 10 g monofilament normal on right where he states he has more discomfort. Does not sense monofilament on any left plantar areas.            Assessment & Plan:  1.  DM:  Much improved control with A1C now in prediabetic range.  Not having lows, so will leave dosing of Glipizide and Metformin as is. Papers to go to optometry for $70 given. Check feet nightly Glucometer and supplies sent  to [facility] again.  2.  Hypertension:  Excellent control  3.  Cholesterol:  Quite good--at goal with all levels.  4.  Peripheral Neuropathy of feet:  To titrate up on Gabapentin gradually to 300 mg 3 times daily.   [facility] for cost reasons as titrates up.  5.  Tinea pedis/callusing of plantar feet:  OTC Terbinafine cream 2 times daily for 14 days, then mix of Tea Tree oil and Gold [place] Foot cream twice daily indefinitely.    Follow up in 3 months mainly for peripheral neuropathy.

## 2018-05-31 ENCOUNTER — Encounter: Payer: Self-pay | Admitting: Internal Medicine

## 2018-05-31 ENCOUNTER — Ambulatory Visit: Payer: Self-pay | Admitting: Internal Medicine

## 2018-05-31 VITALS — BP 122/80 | HR 84 | Resp 12 | Ht 65.0 in | Wt 150.0 lb

## 2018-05-31 DIAGNOSIS — E119 Type 2 diabetes mellitus without complications: Secondary | ICD-10-CM

## 2018-05-31 DIAGNOSIS — B353 Tinea pedis: Secondary | ICD-10-CM

## 2018-05-31 DIAGNOSIS — E1142 Type 2 diabetes mellitus with diabetic polyneuropathy: Secondary | ICD-10-CM

## 2018-05-31 LAB — GLUCOSE, POCT (MANUAL RESULT ENTRY): POC Glucose: 175 mg/dl — AB (ref 70–99)

## 2018-05-31 MED ORDER — GABAPENTIN 300 MG PO CAPS: ORAL_CAPSULE | ORAL | 11 refills | Status: DC

## 2018-05-31 NOTE — Progress Notes (Signed)
Subjective:    Patient ID: Keith Barnes, male    DOB: [birth year], 32 y.o.   MRN: [id number]  HPI  1.  Nausea and vomiting 30 minutes after eating for past 3 days--every meal. After eating, he has saliva build up and nausea.  Ultimately vomits.  After vomiting, he feels cold all over.  Has also had diarrhea on and off for a week.    Does not feel is associated with low blood sugars.  Has not had low blood sugars at all.  He feels he is keeping his meds down, but not clear how he is aware of this.  States he is not taking the medication with his meal--taking 30 minutes before his meals.  He is actually not checking his sugar until midday when he does not feel well--when he checks, almost always measures 150.   Has recently started working and takes his meds before leaving house, not eating breakfast until 30 minutes + later. He has not had this problem before.   He is willing to prepare a good breakfast in the evening to eat more quickly in the morning and take his morning meds with the meal.  He also has difficulty at work getting in a good lunch--everyone is eating on the run.  In the evening around 5 p.m., he eats a small meal and then takes only Metformin.  Does not sound like he is taking the Glipizide in the evening.  States he has not taken what sounds like the Glipizide ever in the evening.  2.  Diabetic Peripheral Neuropathy:  He "finished" his gabapentin yesterday.  Wants to know if he was to continue to refill.  He had worked up to 300 mg 3 times daily with Gabapentin.  His feet are better with the increased dose.  3.  Tinea pedis/Onychomycosis:  Using Tea Tree oil and for some reason Coconut cream.  Feels his feet are better.  Current Meds  Medication Sig  . AGAMATRIX ULTRA-THIN LANCETS MISC Check sugars twice daily before meals  . Blood Glucose Monitoring Suppl (AGAMATRIX PRESTO) w/Device KIT Check sugars twice daily before meals  . gabapentin (NEURONTIN) 100 MG capsule  Titrate to 3 caps by mouth 3 times daily in the morning, midday, and before bedtime  . glipiZIDE (GLUCOTROL) 5 MG tablet 1 tab by mouth twice daily with meals  . glucose blood (AGAMATRIX PRESTO TEST) test strip Check sugars twice daily before meals  . lisinopril (PRINIVIL,ZESTRIL) 10 MG tablet Take 1 tablet (10 mg total) by mouth daily.  . metFORMIN (GLUCOPHAGE) 500 MG tablet 1 tab by mouth twice daily with meals  . Tea Tree 100 % OIL Despues use terbinafine para 14 dias, mezcla Tea tree oil con Gold Bond Foot cream y aplice a sus pies dos veces al dia  . terbinafine (LAMISIL) 1 % cream aplica a sus pies dos veces al dia para 14 dias   No Known Allergies  Review of Systems     Objective:   Physical Exam NAD HEENT:  PERRL EOMI, throat without injection or exudate Neck:  Supple, No adenopathy Chest: CTA CV:  RRR without murmur or rub.  Radial and DP pulses normal and equal Abd:  S, NT, No HSM or mass, + BS LE:  Feet with mild flaking on plantar surface bilaterally       Assessment & Plan:  1.  DM:  Suspect bottoming out with blood sugars and developing nausea and vomiting when takes meds and then  does not eat.  Discussed he always needs to take Glipizide and Metformin with meals. As above, he will work on having easy healthy meals available so he does not skip meals during day.  2.  Diabetic peripheral Neuropathy:  To continue Gabapentin.  Discussed at length difference between "cure" and treatment for his neuropathy.  States he is doing better.  3.  Tinea pedis/onychomycosis:  To continue topical treatment for now.

## 2018-07-25 ENCOUNTER — Encounter: Payer: Self-pay | Admitting: Internal Medicine

## 2018-07-25 ENCOUNTER — Ambulatory Visit: Payer: Self-pay | Admitting: Internal Medicine

## 2018-07-25 VITALS — BP 128/80 | HR 76 | Resp 12 | Ht 65.0 in | Wt 164.0 lb

## 2018-07-25 DIAGNOSIS — B353 Tinea pedis: Secondary | ICD-10-CM

## 2018-07-25 DIAGNOSIS — E1142 Type 2 diabetes mellitus with diabetic polyneuropathy: Secondary | ICD-10-CM

## 2018-07-25 DIAGNOSIS — E119 Type 2 diabetes mellitus without complications: Secondary | ICD-10-CM

## 2018-07-25 DIAGNOSIS — R112 Nausea with vomiting, unspecified: Secondary | ICD-10-CM

## 2018-07-25 LAB — GLUCOSE, POCT (MANUAL RESULT ENTRY): POC Glucose: 193 mg/dl — AB (ref 70–99)

## 2018-07-25 NOTE — Patient Instructions (Signed)
Vacuna de Influenza, gratis:     Tarjeta naranja:  October 17th, Jueves para 2 horas: 8:30 a.m. and 11:30 a.m .

## 2018-07-25 NOTE — Progress Notes (Signed)
   Subjective:    Patient ID: Keith Barnes, male    DOB: 08/31/1986, 32 y.o.   MRN: [id number]  HPI   1.  Diabetic peripheral Neuropathy:  States doing well with current dosing of gabapentin.  He is able to stand longer and stay at work longer now.  Feet no longer feel like they are "asleep" Has never had an eye check.  2.  Episodes of nausea and vomiting with taking meds at different times than when he eats.  Has had no further episodes of nausea and vomiting.  3.  Tinea pedis:  Continues to apply tea tree oil and another oil to nails and feet and is happy with the outcome.  4.   DM:  Checking sugars twice daily before meals and sugar never gets above 180.  Lowest generally is 150.  Has not had influenza vaccine yet this fall.   Current Meds  Medication Sig  . AGAMATRIX ULTRA-THIN LANCETS MISC Check sugars twice daily before meals  . Blood Glucose Monitoring Suppl (AGAMATRIX PRESTO) w/Device KIT Check sugars twice daily before meals  . gabapentin (NEURONTIN) 300 MG capsule 1 cap by mouth 3 times daily in the morning, midday, and before bedtime  . glipiZIDE (GLUCOTROL) 5 MG tablet 1 tab by mouth twice daily with meals  . glucose blood (AGAMATRIX PRESTO TEST) test strip Check sugars twice daily before meals  . lisinopril (PRINIVIL,ZESTRIL) 10 MG tablet Take 1 tablet (10 mg total) by mouth daily.  . metFORMIN (GLUCOPHAGE) 500 MG tablet 1 tab by mouth twice daily with meals  . Tea Tree 100 % OIL Despues use terbinafine para 14 dias, mezcla Tea tree oil con Gold Bond Foot cream y aplice a sus pies dos veces al dia    No Known Allergies     Review of Systems     Objective:   Physical Exam  NAD HEENT:  Periodontal disease Neck: Supple, No adenopathy, no thyromegaly Lungs:  CTA CV:  RRR without murmur or rub, radial and DP pulses normal and equal LE:  Dryness about great toenails, but no discoloration or thickening.        Assessment & Plan:  1.  Peripheral Neuropathy:  Well controlled on Gabapentin  2.  Nausea and vomiting:  Doing well with better effort for regular meals and taking medication with meals (glipizide and metformin)  3.  DM:  Well controlled at last check.  Check A1C today Influenza vaccine on October 17  4. Tinea pedis:  Controlled with topicals.

## 2018-07-26 LAB — HGB A1C W/O EAG: Hgb A1c MFr Bld: 6.5 % — ABNORMAL HIGH (ref 4.8–5.6)

## 2018-11-25 ENCOUNTER — Other Ambulatory Visit: Payer: Self-pay

## 2018-11-25 DIAGNOSIS — Z79899 Other long term (current) drug therapy: Secondary | ICD-10-CM

## 2018-11-25 DIAGNOSIS — E119 Type 2 diabetes mellitus without complications: Secondary | ICD-10-CM

## 2018-11-25 DIAGNOSIS — Z13220 Encounter for screening for lipoid disorders: Secondary | ICD-10-CM

## 2018-11-26 LAB — COMPREHENSIVE METABOLIC PANEL
A/G RATIO: 1.4 (ref 1.2–2.2)
ALT: 49 IU/L — ABNORMAL HIGH (ref 0–44)
AST: 23 IU/L (ref 0–40)
Albumin: 4.4 g/dL (ref 4.0–5.0)
Alkaline Phosphatase: 136 IU/L — ABNORMAL HIGH (ref 39–117)
BUN/Creatinine Ratio: 15 (ref 9–20)
BUN: 11 mg/dL (ref 6–20)
Bilirubin Total: 0.8 mg/dL (ref 0.0–1.2)
CALCIUM: 9.2 mg/dL (ref 8.7–10.2)
CO2: 22 mmol/L (ref 20–29)
CREATININE: 0.71 mg/dL — AB (ref 0.76–1.27)
Chloride: 98 mmol/L (ref 96–106)
GFR calc Af Amer: 145 mL/min/{1.73_m2} (ref >59)
GFR, EST NON AFRICAN AMERICAN: 125 mL/min/{1.73_m2} (ref >59)
GLOBULIN, TOTAL: 3.2 g/dL (ref 1.5–4.5)
Glucose: 355 mg/dL — ABNORMAL HIGH (ref 65–99)
POTASSIUM: 4.2 mmol/L (ref 3.5–5.2)
SODIUM: 136 mmol/L (ref 134–144)
Total Protein: 7.6 g/dL (ref 6.0–8.5)

## 2018-11-26 LAB — MICROALBUMIN / CREATININE URINE RATIO
Creatinine, Urine: 52.1 mg/dL
Microalb/Creat Ratio: <6 mg/g creat (ref 0–29)

## 2018-11-26 LAB — LIPID PANEL W/O CHOL/HDL RATIO
Cholesterol, Total: 125 mg/dL (ref 100–199)
HDL: 39 mg/dL — ABNORMAL LOW (ref >39)
LDL CALC: 51 mg/dL (ref 0–99)
TRIGLYCERIDES: 176 mg/dL — AB (ref 0–149)
VLDL Cholesterol Cal: 35 mg/dL (ref 5–40)

## 2018-11-26 LAB — HGB A1C W/O EAG: Hgb A1c MFr Bld: 11.1 % — ABNORMAL HIGH (ref 4.8–5.6)

## 2018-11-28 ENCOUNTER — Encounter: Payer: Self-pay | Admitting: Internal Medicine

## 2019-01-02 ENCOUNTER — Encounter: Payer: Self-pay | Admitting: Internal Medicine

## 2019-01-29 ENCOUNTER — Encounter: Payer: Self-pay | Admitting: Internal Medicine

## 2019-03-18 ENCOUNTER — Other Ambulatory Visit: Payer: Self-pay

## 2019-03-18 ENCOUNTER — Ambulatory Visit: Payer: Self-pay | Admitting: Internal Medicine

## 2019-03-18 ENCOUNTER — Encounter: Payer: Self-pay | Admitting: Internal Medicine

## 2019-03-18 VITALS — BP 124/86 | HR 68 | Resp 12 | Ht 65.0 in | Wt 174.0 lb

## 2019-03-18 DIAGNOSIS — Z79899 Other long term (current) drug therapy: Secondary | ICD-10-CM

## 2019-03-18 DIAGNOSIS — E785 Hyperlipidemia, unspecified: Secondary | ICD-10-CM

## 2019-03-18 DIAGNOSIS — E1142 Type 2 diabetes mellitus with diabetic polyneuropathy: Secondary | ICD-10-CM

## 2019-03-18 DIAGNOSIS — I10 Essential (primary) hypertension: Secondary | ICD-10-CM

## 2019-03-18 DIAGNOSIS — Z0000 Encounter for general adult medical examination without abnormal findings: Secondary | ICD-10-CM

## 2019-03-18 LAB — GLUCOSE, POCT (MANUAL RESULT ENTRY): POC Glucose: 356 mg/dl — AB (ref 70–99)

## 2019-03-18 MED ORDER — GLUCOSE BLOOD VI STRP: ORAL_STRIP | 12 refills | Status: AC

## 2019-03-18 MED ORDER — AGAMATRIX ULTRA-THIN LANCETS MISC: 11 refills | Status: AC

## 2019-03-18 MED ORDER — GLIPIZIDE 5 MG PO TABS: ORAL_TABLET | ORAL | 11 refills | Status: AC

## 2019-03-18 MED ORDER — LISINOPRIL 10 MG PO TABS: 10.0000 mg | ORAL_TABLET | Freq: Every day | ORAL | 11 refills | Status: AC

## 2019-03-18 MED ORDER — METFORMIN HCL 500 MG PO TABS: ORAL_TABLET | ORAL | 11 refills | Status: AC

## 2019-03-18 MED ORDER — GABAPENTIN 300 MG PO CAPS: ORAL_CAPSULE | ORAL | 11 refills | Status: AC

## 2019-03-18 MED ORDER — AGAMATRIX PRESTO W/DEVICE KIT: PACK | 0 refills | Status: AC

## 2019-03-18 NOTE — Progress Notes (Signed)
Subjective:    Patient ID: Keith Barnes, male   DOB: 16-May-1986, 33 y.o.   MRN: [id number]   HPI   Here for Male CPE:  1.  STE:  History of small right hydrocele and epididymal cysts.  Does check in shower regularly.  No changes from last year when had ultrasound showing the hydrocele and cysts.  No changes from last June.  No family history of testicular cancer.    2.  PSA/DRE:  No history of PSA or DRE.  Paternal first cousin with history of prostate cancer at age 70 yo.  Died age 72 year later.    3.  Guaiac Cards:  Never.   4.  Colonoscopy:  Never.  No family history of colon cancer.  5.  Cholesterol/Glucose:  Has both DM and dyslipidemia for which he is treated.  Diabetes was not well controlled at last check in February before pandemic.  States he was not eating well previously when working in [employer].  Working outside now and eating and drinking better, though still drinking Gatorade frequently. Not checking sugars as his glucometer is not working.   LDL was at goal, however, in February.  He is not on medication for cholesterol. Lipid Panel     Component Value Date/Time   CHOL 125 11/25/2018 0848   TRIG 176 (H) 11/25/2018 0848   HDL 39 (L) 11/25/2018 0848   LDLCALC 51 11/25/2018 0848    6.  Immunizations:  Did not obtain an influenza vaccine this past fall.  Immunization History  Administered Date(s) Administered  . Pneumococcal Polysaccharide-23 12/06/2017  . Tdap 12/06/2017    Current Meds  Medication Sig  . gabapentin (NEURONTIN) 300 MG capsule 1 cap by mouth 3 times daily in the morning, midday, and before bedtime  . glipiZIDE (GLUCOTROL) 5 MG tablet 1 tab by mouth twice daily with meals  . lisinopril (PRINIVIL,ZESTRIL) 10 MG tablet Take 1 tablet (10 mg total) by mouth daily.  . metFORMIN (GLUCOPHAGE) 500 MG tablet 1 tab by mouth twice daily with meals   No Known Allergies  Past Medical History:  Diagnosis Date  . Diabetes mellitus without  complication (HCC)   . Diabetic peripheral neuropathy (HCC)   . Dyslipidemia   . Epididymal cyst 03/2018  . Hydrocele, right 03/2018    History reviewed. No pertinent surgical history.  Family History  Problem Relation Age of Onset  . Diabetes Mother        complications of DM was cause of death  . Hypertension Mother   . Heart disease Mother   . Cancer Cousin 94       Died age 25 of prostate cancer   Social History   Socioeconomic History  . Marital status: Married    Spouse name: Radiographer, therapeutic   . Number of children: 1  . Years of education: 61  . Highest education level: 11th grade  Occupational History  . Occupation: Holiday representative    Comment: No longer working at [employer] since pandemic  Social Needs  . Financial resource strain: Not hard at all  . Food insecurity:    Worry: Never true    Inability: Never true  . Transportation needs:    Medical: No    Non-medical: No  Tobacco Use  . Smoking status: Never Smoker  . Smokeless tobacco: Never Used  Substance and Sexual Activity  . Alcohol use: Not Currently    Frequency: Never    Comment: Did drink a  lot of beer before diagnosis of DM, but none now.  . Drug use: Not Currently    Types: Marijuana, Cocaine    Comment: None since about 2000  . Sexual activity: Yes    Birth control/protection: Implant  Lifestyle  . Physical activity:    Days per week: 7 days    Minutes per session: 100 min  . Stress: Not at all  Relationships  . Social connections:    Talks on phone: Three times a week    Gets together: Not on file    Attends religious service: Not on file    Active member of club or organization: Not on file    Attends meetings of clubs or organizations: Not on file    Relationship status: Married  . Intimate partner violence:    Fear of current or ex partner: No    Emotionally abused: No    Physically abused: No    Forced sexual activity: No  Other Topics Concern  . Not on file  Social  History Narrative   Lives at home with wife and young daughter.   Enjoys working outdoors in Holiday representative much better [employer].      Review of Systems  Constitutional: Negative for appetite change and fatigue.  HENT: Negative for dental problem (Has not had a dental exam for at least 1 year.), ear pain, hearing loss, rhinorrhea, sneezing and sore throat.   Eyes: Negative for visual disturbance (Last eye check was 1 year ago.  ).  Respiratory: Negative for cough and shortness of breath.   Cardiovascular: Negative for chest pain, palpitations and leg swelling.  Gastrointestinal: Negative for abdominal pain, blood in stool (No melena.), constipation and diarrhea.  Genitourinary: Negative for discharge, frequency, scrotal swelling and testicular pain.  Musculoskeletal: Negative for arthralgias.  Skin: Negative for rash.       No concerning nevi or other lesions.  Neurological: Negative for weakness and numbness.  Psychiatric/Behavioral: Negative for dysphoric mood. The patient is not nervous/anxious.       Objective:   BP 124/86 (BP Location: Left Arm, Patient Position: Sitting, Cuff Size: Normal)   Pulse 68   Resp 12   Ht  (1.651 m)   Wt 174 lb (78.9 kg)   BMI 28.96 kg/m   Physical Exam  Constitutional: He is oriented to person, place, and time. He appears well-developed and well-nourished.  HENT:  Head: Normocephalic and atraumatic.  Right Ear: Hearing, tympanic membrane, external ear and ear canal normal.  Left Ear: Hearing, tympanic membrane, external ear and ear canal normal.  Nose: Nose normal.  Mouth/Throat: Uvula is midline, oropharynx is clear and moist and mucous membranes are normal. Abnormal dentition (dental decay).  Eyes: Pupils are equal, round, and reactive to light. Conjunctivae and EOM are normal.  Discs sharp bilaterally  Neck: Normal range of motion and full passive range of motion without pain. Neck supple. No thyroid mass and no  thyromegaly present.  Cardiovascular: Normal rate, regular rhythm, S1 normal and S2 normal. Exam reveals no S3, no S4 and no friction rub.  No murmur heard. No carotid bruits.  Carotid, radial, femoral, DP and PT pulses normal and equal.   Pulmonary/Chest: Effort normal and breath sounds normal.  Abdominal: Soft. Bowel sounds are normal. He exhibits no mass. There is no hepatosplenomegaly. There is no abdominal tenderness. No hernia. Hernia confirmed negative in the right inguinal area and confirmed negative in the left inguinal area.  Genitourinary:  Penis normal.  Right testis shows mass (larger volume than left on exam.  Known hydrocele on right side.). Right testis shows no swelling and no tenderness. Left testis shows no mass, no swelling and no tenderness. Left testis is descended. Uncircumcised.  Musculoskeletal: Normal range of motion.  Lymphadenopathy:       Head (right side): No submental and no submandibular adenopathy present.       Head (left side): No submental and no submandibular adenopathy present.    He has no cervical adenopathy.    He has no axillary adenopathy.       Right: No inguinal and no supraclavicular adenopathy present.       Left: No inguinal and no supraclavicular adenopathy present.  Neurological: He is alert and oriented to person, place, and time. He has normal strength and normal reflexes. No cranial nerve deficit or sensory deficit. Coordination and gait normal.  Diabetic Foot Exam - Simple   Simple Foot Form Diabetic Foot exam was performed with the following findings:  Yes  03/18/2019  9:55 AM  Visual Inspection No deformities, no ulcerations, no other skin breakdown bilaterally:  Yes Sensation Testing Intact to touch and monofilament testing bilaterally:  Yes Pulse Check Posterior Tibialis and Dorsalis pulse intact bilaterally:  Yes Comments     Skin: Skin is warm, dry and intact. No lesion and no rash noted.  Psychiatric: He has a normal mood and  affect. His speech is normal and behavior is normal. Judgment and thought content normal. Cognition and memory are normal.     Assessment & Plan   1.  Annual exam. To call in September for influenza vaccine clinics CBC, CMP, A1C, FLP, Urine microalbumin/crea, HIV  2.  DM:  Was not well controlled in February. Describes a healthier lifestyle, however, eating more carbs.  Discussed working on more vegetables/proteins and good fats when hungry instead of more rice and tortillas. Encouraged avoiding Gatorade and drinking more water. Should have been out of some of his meds by now if taking as prescribed. Labs as above.  3.  Hypertension:  Controlled  4.  Right hydrocele:  No change.  5.  Peripheral neuropathy:  No symptoms on Gabapentin and with change in work.  Exam normal today.

## 2019-03-19 LAB — CBC WITH DIFFERENTIAL/PLATELET
Basophils Absolute: 0.1 10*3/uL (ref 0.0–0.2)
Basos: 1 %
EOS (ABSOLUTE): 0.2 10*3/uL (ref 0.0–0.4)
Eos: 3 %
Hematocrit: 47 % (ref 37.5–51.0)
Hemoglobin: 15.6 g/dL (ref 13.0–17.7)
Immature Grans (Abs): 0 10*3/uL (ref 0.0–0.1)
Immature Granulocytes: 0 %
Lymphocytes Absolute: 2.7 10*3/uL (ref 0.7–3.1)
Lymphs: 38 %
MCH: 31.1 pg (ref 26.6–33.0)
MCHC: 33.2 g/dL (ref 31.5–35.7)
MCV: 94 fL (ref 79–97)
Monocytes Absolute: 0.5 10*3/uL (ref 0.1–0.9)
Monocytes: 8 %
Neutrophils Absolute: 3.5 10*3/uL (ref 1.4–7.0)
Neutrophils: 50 %
Platelets: 247 10*3/uL (ref 150–450)
RBC: 5.02 x10E6/uL (ref 4.14–5.80)
RDW: 13 % (ref 11.6–15.4)
WBC: 7.1 10*3/uL (ref 3.4–10.8)

## 2019-03-19 LAB — COMPREHENSIVE METABOLIC PANEL
ALT: 62 IU/L — ABNORMAL HIGH (ref 0–44)
AST: 39 IU/L (ref 0–40)
Albumin/Globulin Ratio: 1.6 (ref 1.2–2.2)
Albumin: 5 g/dL (ref 4.0–5.0)
Alkaline Phosphatase: 141 IU/L — ABNORMAL HIGH (ref 39–117)
BUN/Creatinine Ratio: 15 (ref 9–20)
BUN: 11 mg/dL (ref 6–20)
Bilirubin Total: 1 mg/dL (ref 0.0–1.2)
CO2: 21 mmol/L (ref 20–29)
Calcium: 9.7 mg/dL (ref 8.7–10.2)
Chloride: 97 mmol/L (ref 96–106)
Creatinine, Ser: 0.73 mg/dL — ABNORMAL LOW (ref 0.76–1.27)
GFR calc Af Amer: 142 mL/min/{1.73_m2} (ref >59)
GFR calc non Af Amer: 123 mL/min/{1.73_m2} (ref >59)
Globulin, Total: 3.2 g/dL (ref 1.5–4.5)
Glucose: 327 mg/dL — ABNORMAL HIGH (ref 65–99)
Potassium: 4.4 mmol/L (ref 3.5–5.2)
Sodium: 136 mmol/L (ref 134–144)
Total Protein: 8.2 g/dL (ref 6.0–8.5)

## 2019-03-19 LAB — MICROALBUMIN / CREATININE URINE RATIO
Creatinine, Urine: 57.3 mg/dL
Microalb/Creat Ratio: <5 mg/g creat (ref 0–29)
Microalbumin, Urine: <3 ug/mL

## 2019-03-19 LAB — LIPID PANEL W/O CHOL/HDL RATIO
Cholesterol, Total: 160 mg/dL (ref 100–199)
HDL: 46 mg/dL (ref >39)
LDL Calculated: 72 mg/dL (ref 0–99)
Triglycerides: 211 mg/dL — ABNORMAL HIGH (ref 0–149)
VLDL Cholesterol Cal: 42 mg/dL — ABNORMAL HIGH (ref 5–40)

## 2019-03-19 LAB — HGB A1C W/O EAG: Hgb A1c MFr Bld: 12.4 % — ABNORMAL HIGH (ref 4.8–5.6)

## 2019-03-19 LAB — HIV ANTIBODY (ROUTINE TESTING W REFLEX): HIV Screen 4th Generation wRfx: NONREACTIVE

## 2019-05-29 ENCOUNTER — Ambulatory Visit: Payer: Self-pay | Admitting: Internal Medicine

## 2019-06-04 ENCOUNTER — Other Ambulatory Visit: Payer: Self-pay

## 2019-06-06 ENCOUNTER — Other Ambulatory Visit: Payer: Self-pay

## 2019-06-18 IMAGING — US US SCROTUM W/ DOPPLER COMPLETE
1 series · 14 of 25 positions shown · non-contrast
Comparison: None.

CLINICAL DATA: Testicular pain for 1 week

EXAM:
SCROTAL ULTRASOUND
DOPPLER ULTRASOUND OF THE TESTICLES
TECHNIQUE: Complete ultrasound examination of the testicles, epididymis, and
other scrotal structures was performed. Color and spectral Doppler
ultrasound were also utilized to evaluate blood flow to the
testicles.

[Series 1: us scrotum w/ doppler complete · 0.07mm/px · 14 of 56 slices shown]
[im 1/56]
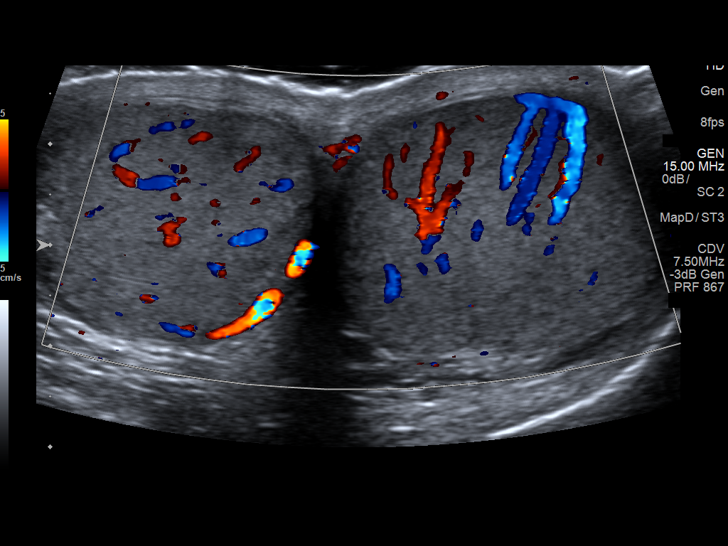
[im 5/56]
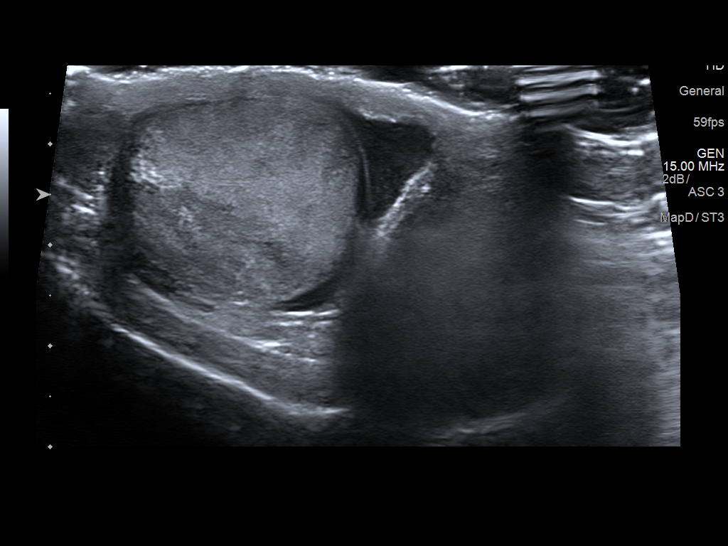
[im 10/56]
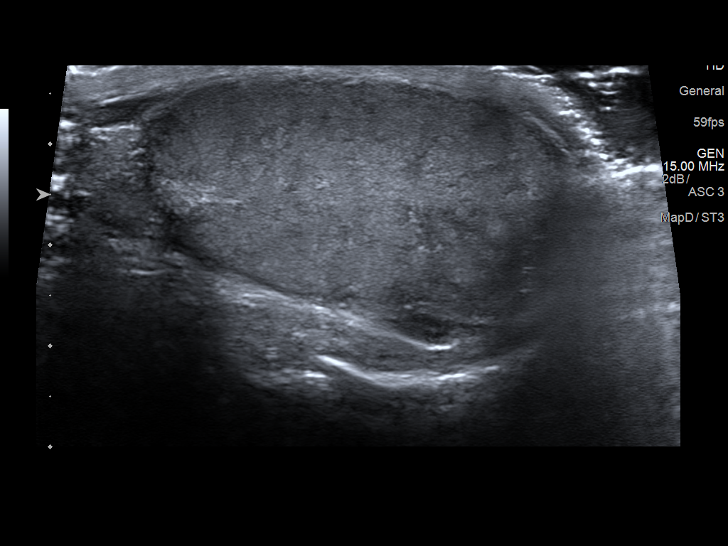
[im 14/56]
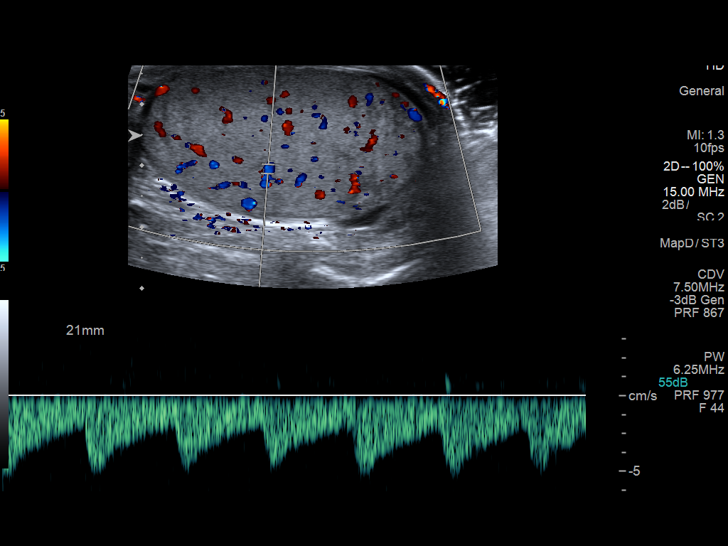
[im 19/56]
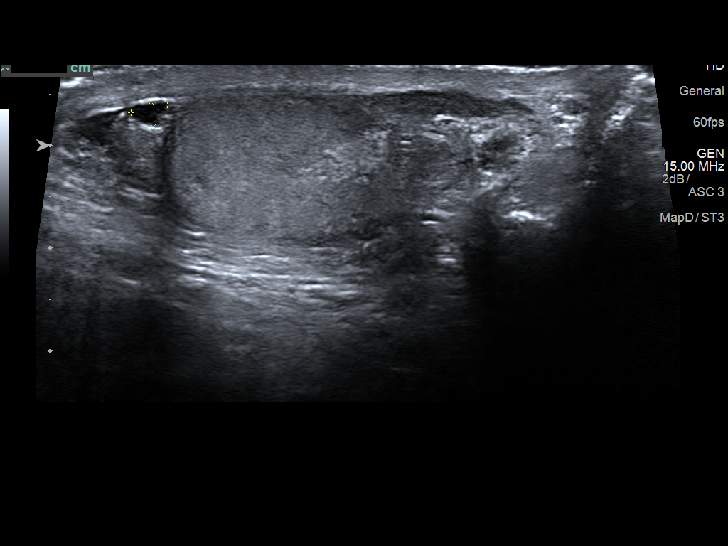
[im 21/56]
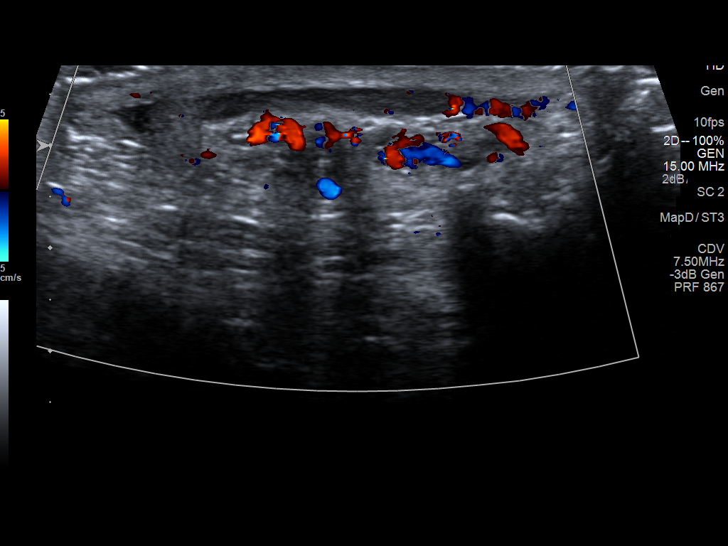
[im 26/56]
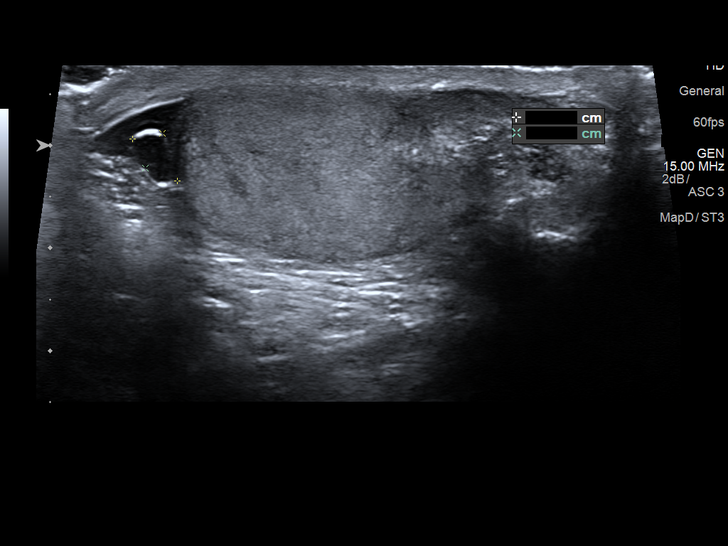
[im 30/56]
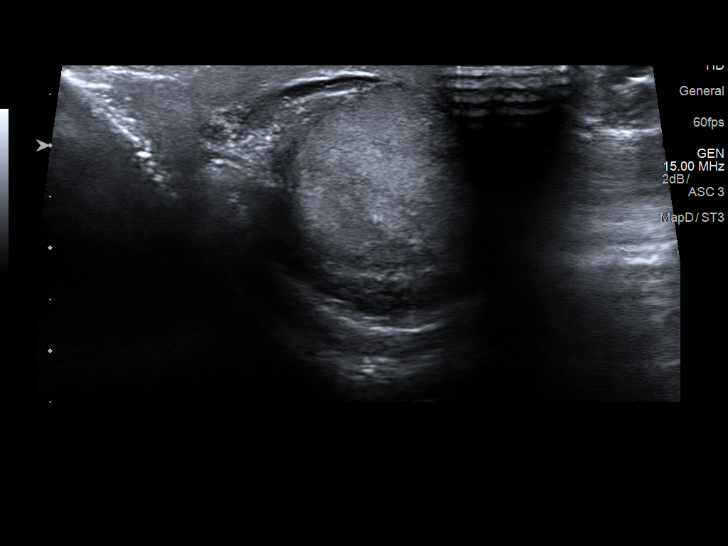
[im 35/56]
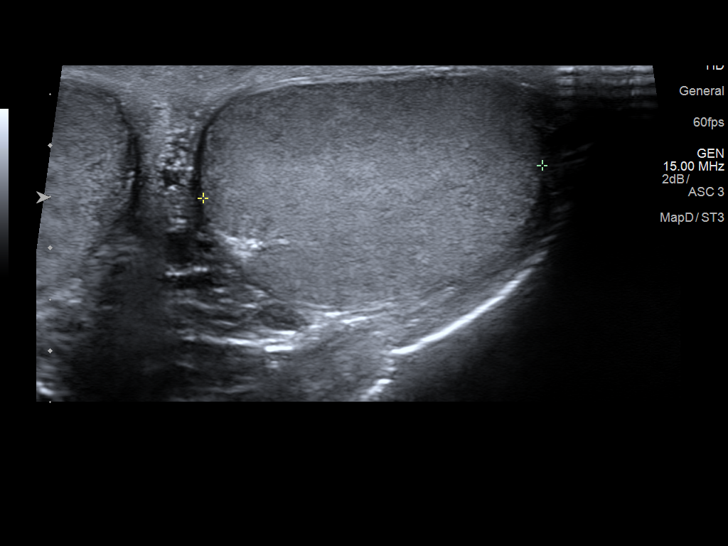
[im 37/56]
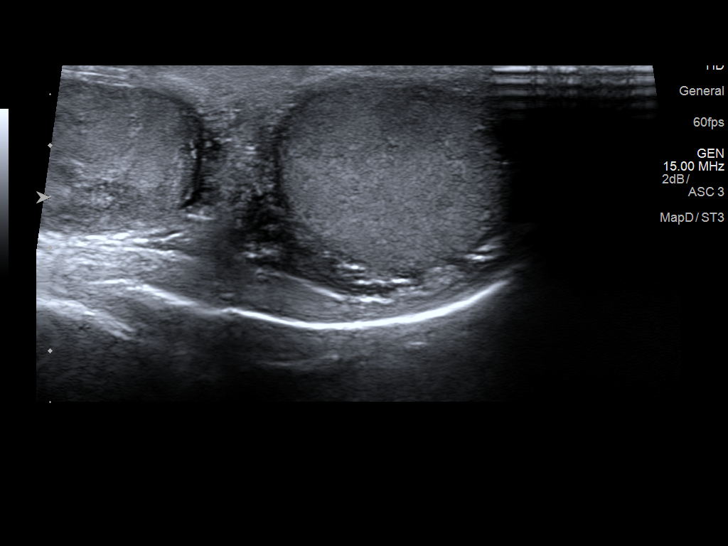
[im 42/56]
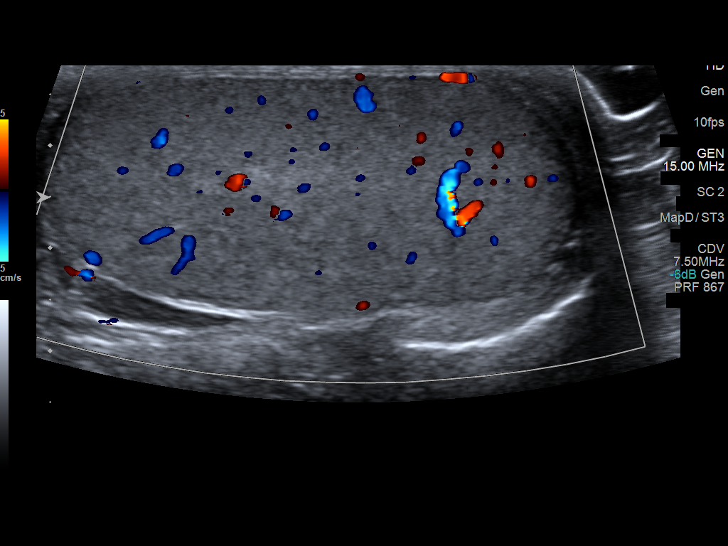
[im 46/56]
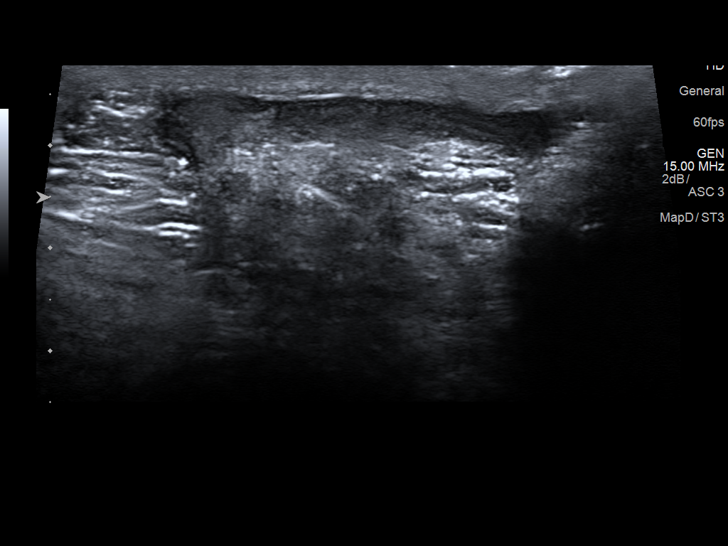
[im 51/56]
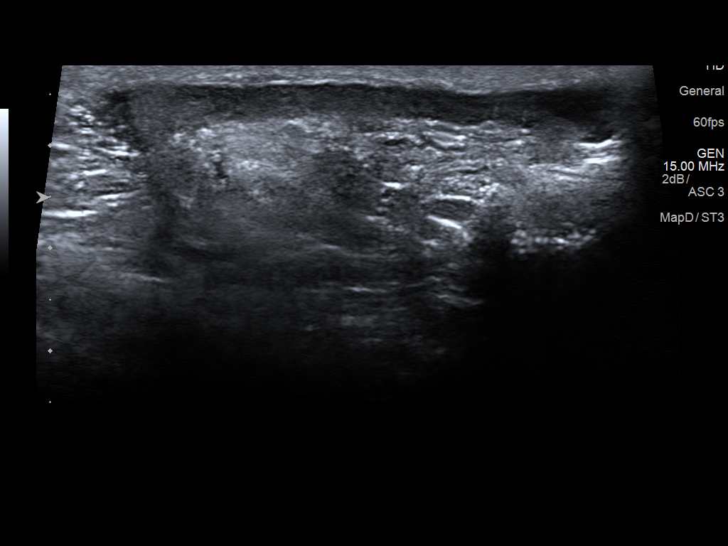
[im 56/56]
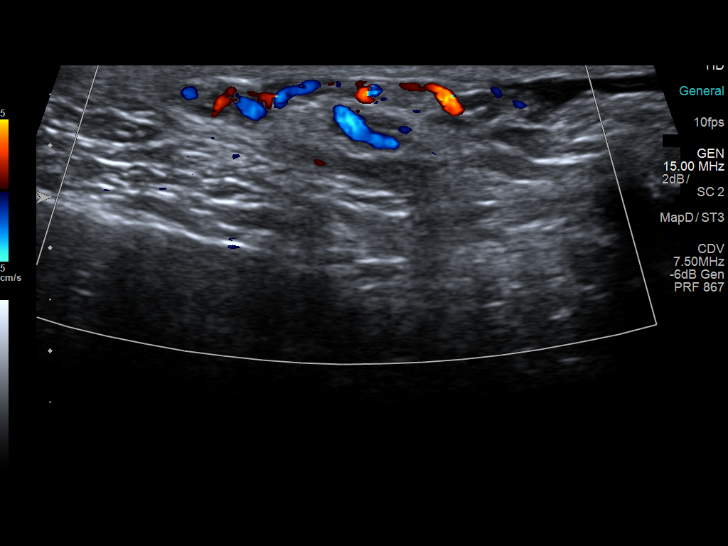

[14 of 25 positions shown; findings below may reference images not displayed]

FINDINGS: Right testicle

Measurements: 4.6 x 2.3 x 2.9 cm. No mass or microlithiasis
visualized. Slightly heterogeneous echotexture.

Left testicle

Measurements: 5 x 2.2 x 3.3 cm. No mass or microlithiasis
visualized.

Right epididymis:  Small cysts measuring 0.5 and 0.6 cm.

Left epididymis:  Normal in size and appearance.

Hydrocele:  Tiny right hydrocele.

Varicocele:  None visualized.

Pulsed Doppler interrogation of both testes demonstrates normal low
resistance arterial and venous waveforms bilaterally.
IMPRESSION: 1. Negative for testicular torsion or testicular mass.
2. Tiny right hydrocele.  Small right epididymal cysts

## 2019-07-17 ENCOUNTER — Ambulatory Visit: Payer: Self-pay | Admitting: Internal Medicine

## 2020-03-25 IMAGING — CT CT ABD-PELV W/ CM
2 of 4 series · 17 of 46 positions shown, 19 images · IV contrast (APPLIED)
Comparison: None.

CLINICAL DATA: Nausea, vomiting and lower abdominal pain for 1
week. Foot pain for months. History of diabetes.

EXAM:
CT ABDOMEN AND PELVIS WITH CONTRAST
TECHNIQUE: Multidetector CT imaging of the abdomen and pelvis was performed
using the standard protocol following bolus administration of
intravenous contrast.
CONTRAST:  100mL OMNIPAQUE IOHEXOL 300 MG/ML  SOLN

[Series 3: abdomen 5.0 · axial · 0.79mm/px · z∈[+699,+1144]mm · 14 of 101 slices shown, 16 images]
[im 6/101  soft-tissue]
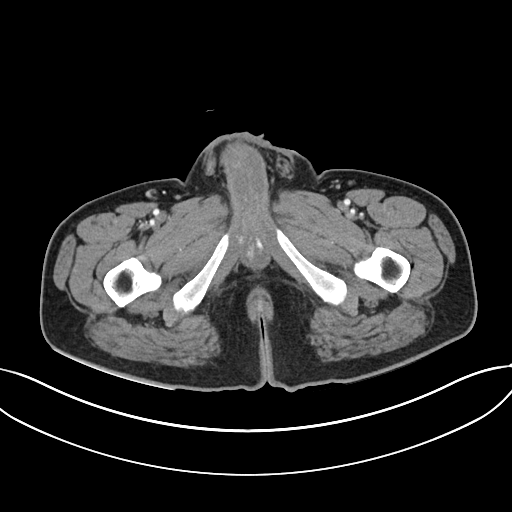
[im 6/101  bone]
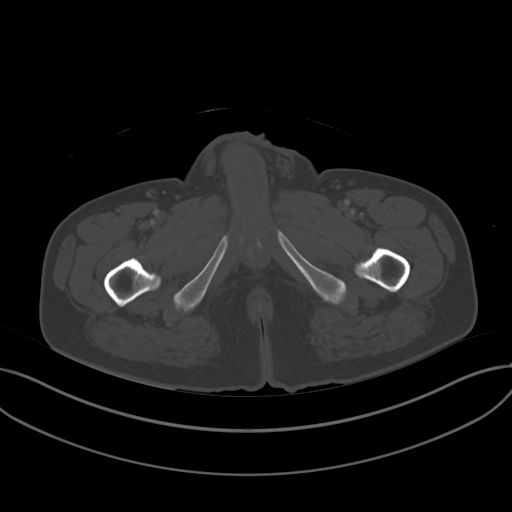
[im 12/101  soft-tissue]
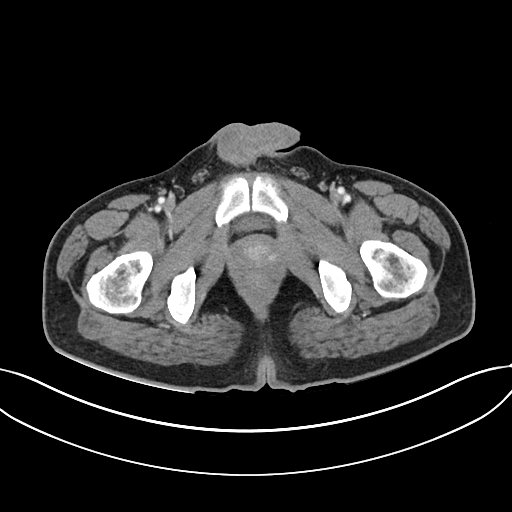
[im 23/101  soft-tissue]
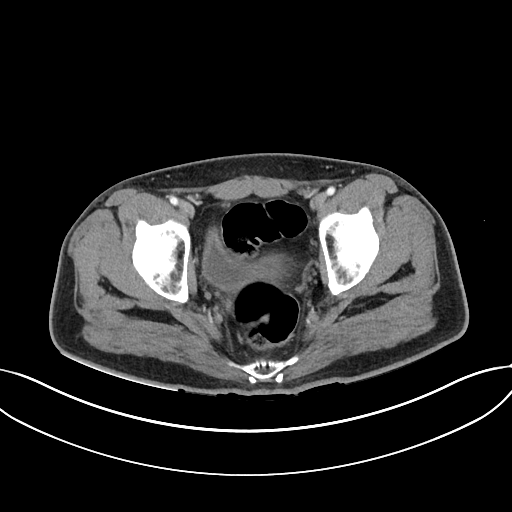
[im 28/101  soft-tissue]
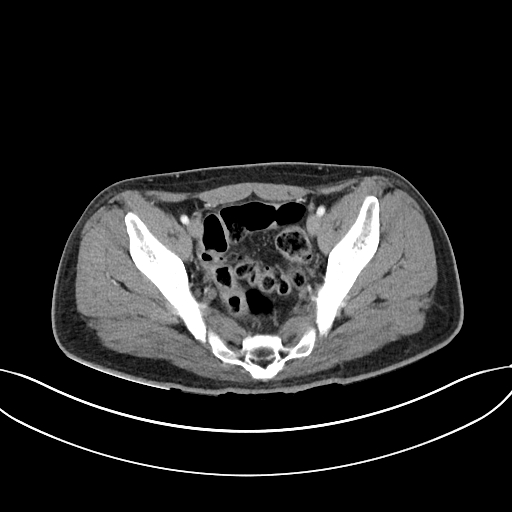
[im 34/101  soft-tissue]
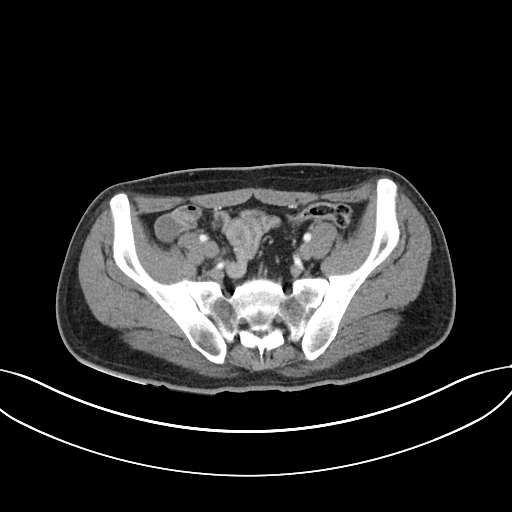
[im 39/101  soft-tissue]
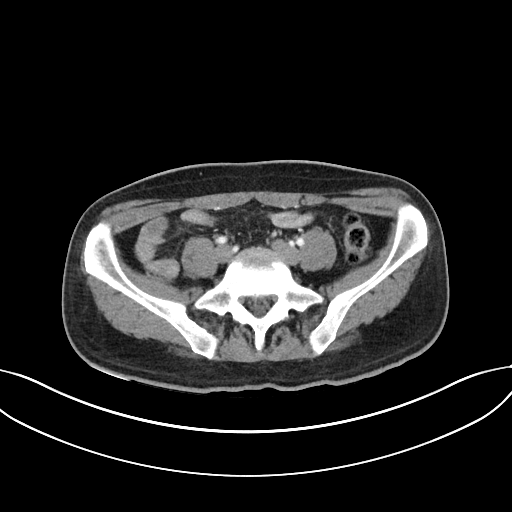
[im 45/101  soft-tissue]
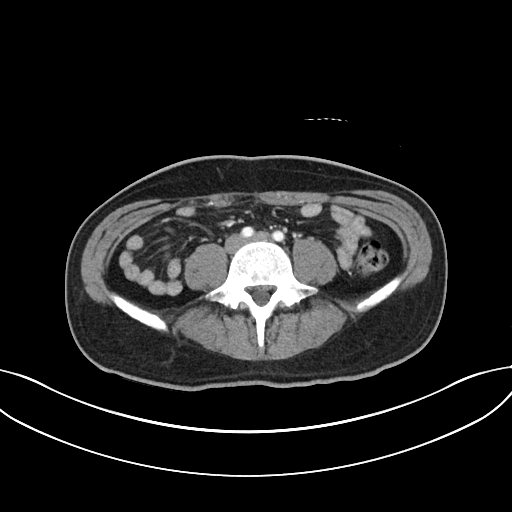
[im 56/101  soft-tissue]
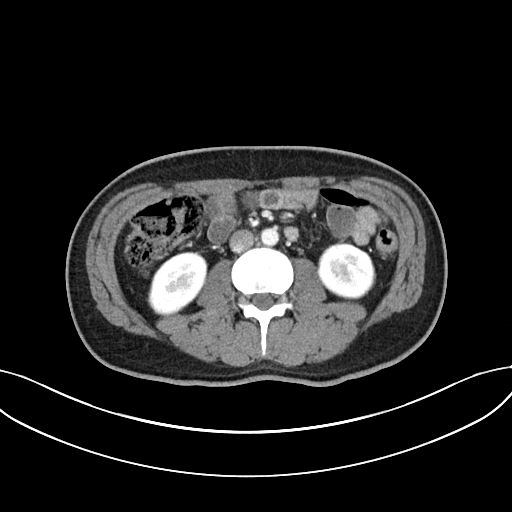
[im 62/101  soft-tissue]
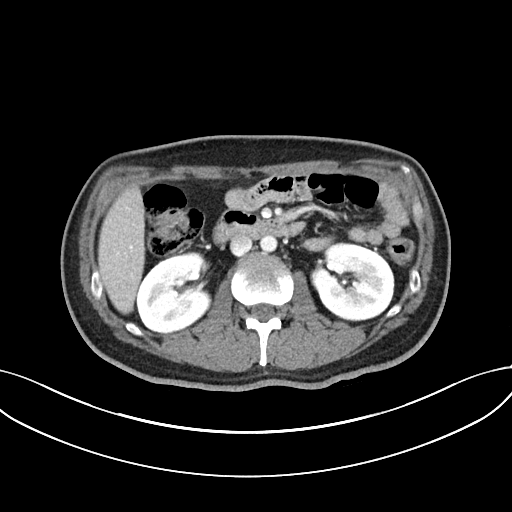
[im 62/101  bone]
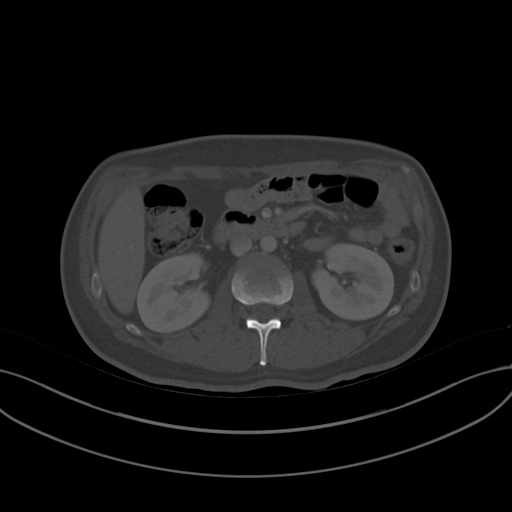
[im 67/101  soft-tissue]
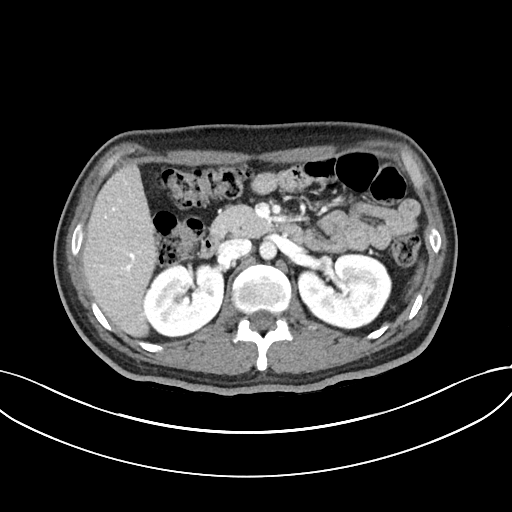
[im 73/101  soft-tissue]
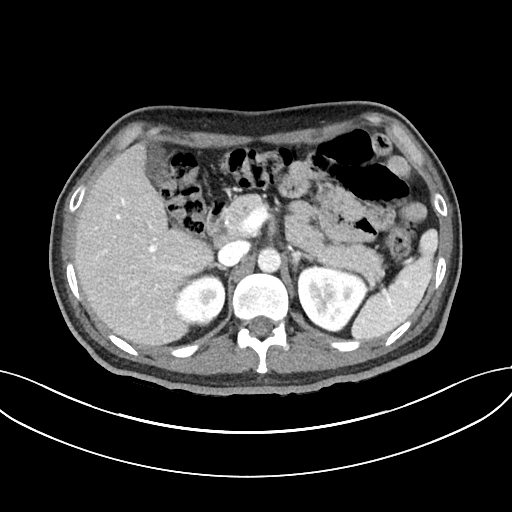
[im 78/101  soft-tissue]
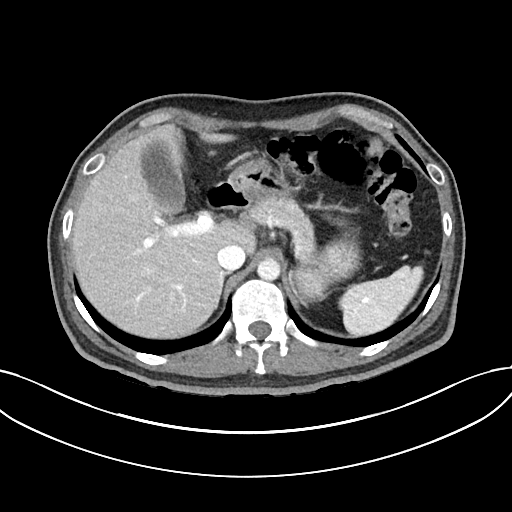
[im 89/101  soft-tissue]
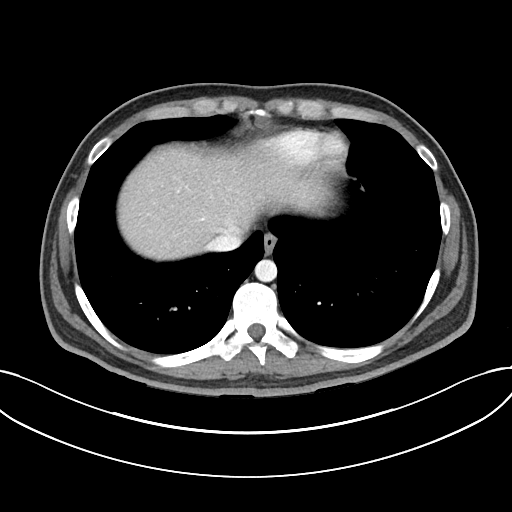
[im 95/101  soft-tissue]
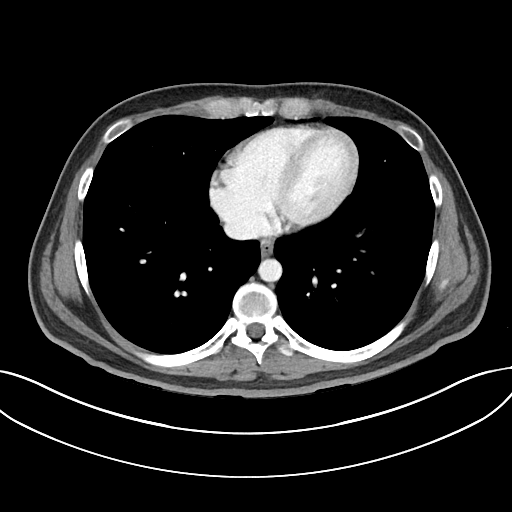

[Series 6: abdomen 3.0 mpr cor · coronal · 0.91mm/px · 3 of 95 slices shown]
[im 32/95  soft-tissue]
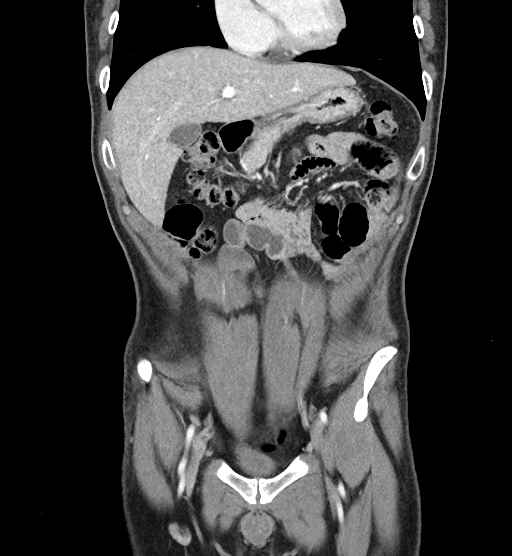
[im 42/95  soft-tissue]
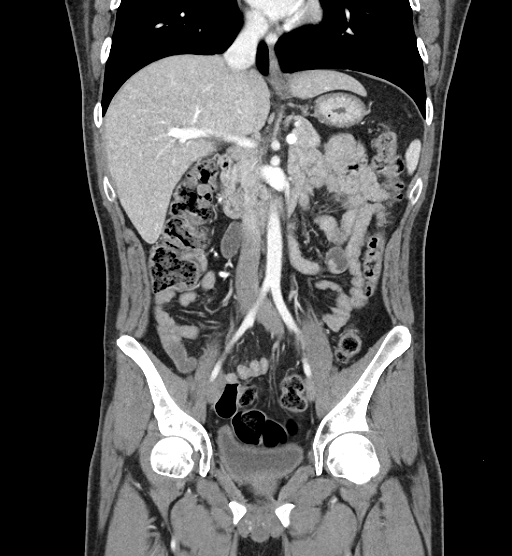
[im 53/95  soft-tissue]
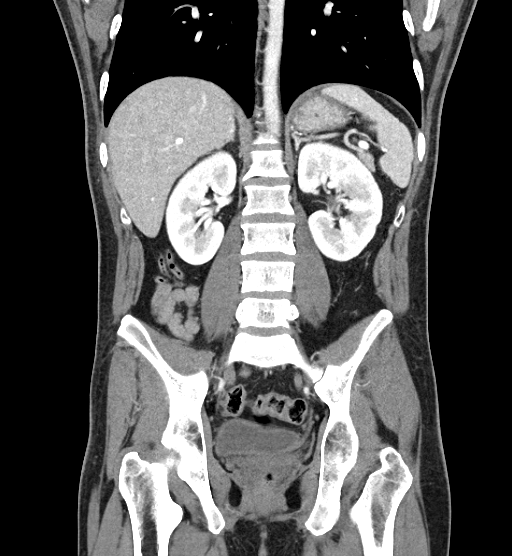

[17 of 46 positions shown; findings below may reference images not displayed]

FINDINGS: LOWER CHEST: Lung bases are clear. Included heart size is normal. No
pericardial effusion.

HEPATOBILIARY: Liver and gallbladder are normal.

PANCREAS: Normal.

SPLEEN: Normal.

ADRENALS/URINARY TRACT: Kidneys are orthotopic, demonstrating
symmetric enhancement. No nephrolithiasis, hydronephrosis or solid
renal masses. The unopacified ureters are normal in course and
caliber. Urinary bladder is partially distended and unremarkable.
Normal adrenal glands.

STOMACH/BOWEL: The stomach, small and large bowel are normal in
course and caliber without inflammatory changes. Normal appendix.

VASCULAR/LYMPHATIC: Aortoiliac vessels are normal in course and
caliber. No lymphadenopathy by CT size criteria.

REPRODUCTIVE: Normal.

OTHER: No intraperitoneal free fluid or free air.

MUSCULOSKELETAL: Nonacute.  Mild L1-2 ventral endplate spurring.
IMPRESSION: Negative contrast enhanced CT abdomen and pelvis.
